# Patient Record
Sex: Male | Born: 1975 | Race: Black or African American | Hispanic: No | Marital: Single | Smoking: Never smoker
Health system: Southern US, Community
[De-identification: ages and names within clinical notes are randomized; demographics above are authoritative.]

## PROBLEM LIST (undated history)

## (undated) DIAGNOSIS — T7840XA Allergy, unspecified, initial encounter: Secondary | ICD-10-CM

## (undated) HISTORY — DX: Allergy, unspecified, initial encounter: T78.40XA

---

## 1997-10-07 ENCOUNTER — Encounter: Admission: RE | Admit: 1997-10-07 | Discharge: 1997-10-07 | Payer: Self-pay | Admitting: *Deleted

## 1997-10-10 ENCOUNTER — Encounter: Admission: RE | Admit: 1997-10-10 | Discharge: 1997-10-10 | Payer: Self-pay | Admitting: *Deleted

## 2004-06-24 ENCOUNTER — Ambulatory Visit: Payer: Self-pay | Admitting: Internal Medicine

## 2004-06-26 ENCOUNTER — Ambulatory Visit: Payer: Self-pay | Admitting: Internal Medicine

## 2004-06-30 ENCOUNTER — Ambulatory Visit (HOSPITAL_COMMUNITY): Admission: RE | Admit: 2004-06-30 | Discharge: 2004-06-30 | Payer: Self-pay | Admitting: Internal Medicine

## 2011-03-10 ENCOUNTER — Ambulatory Visit (INDEPENDENT_AMBULATORY_CARE_PROVIDER_SITE_OTHER): Payer: BC Managed Care – PPO

## 2011-03-10 DIAGNOSIS — Z0000 Encounter for general adult medical examination without abnormal findings: Secondary | ICD-10-CM

## 2011-06-16 ENCOUNTER — Ambulatory Visit (INDEPENDENT_AMBULATORY_CARE_PROVIDER_SITE_OTHER): Payer: BC Managed Care – PPO | Admitting: Family Medicine

## 2011-06-16 ENCOUNTER — Ambulatory Visit: Payer: BC Managed Care – PPO

## 2011-06-16 VITALS — BP 113/73 | HR 61 | Temp 98.2°F | Resp 16 | Ht 71.75 in | Wt 200.4 lb

## 2011-06-16 DIAGNOSIS — M542 Cervicalgia: Secondary | ICD-10-CM

## 2011-06-16 DIAGNOSIS — J309 Allergic rhinitis, unspecified: Secondary | ICD-10-CM

## 2011-06-16 DIAGNOSIS — M25569 Pain in unspecified knee: Secondary | ICD-10-CM

## 2011-06-16 MED ORDER — CYCLOBENZAPRINE HCL 5 MG PO TABS: 5.0000 mg | ORAL_TABLET | Freq: Every evening | ORAL | Status: AC | PRN

## 2011-06-16 MED ORDER — FLUTICASONE PROPIONATE 50 MCG/ACT NA SUSP: 2.0000 | Freq: Every day | NASAL | Status: DC

## 2011-06-16 MED ORDER — NAPROXEN 500 MG PO TABS: 500.0000 mg | ORAL_TABLET | Freq: Two times a day (BID) | ORAL | Status: DC

## 2011-06-16 NOTE — Progress Notes (Signed)
[facility]:  Office Visit  Chief Complaint:  Chief Complaint  Patient presents with  . Knee Pain    x 1 month swelling and painful  . Neck Pain    x 2 weeks sore after playing basketball  . Facial Pain    sinus pressure sneezing runny nose x 2 weeks    HPI: Zachary Harrington is a 36 y.o. male who complains of : 1. Right knee pain-patient states there is no pain just a little pressure x 1 month. After playing basketball. Hears a pop now and again. Denies numbness, weakness, tingling, or instability. Tried Advil and Ibuprofen regular. SOme relief 2. Neck pain-worse when laying down. Feels mostly on right side. X 2 weeks since after basketball game and ran into shoulder of another player at full impact. Denies confusion, vision changes, HA.  Rt >left side affect neck. Has tried Ibuprofen with some relief. 4/10 uncomforatbale pain, has to change positon to feel more comfortable.  3. Sinuses and allergies. Takes Clotrimaton ( OTC brand).   Past Medical History  Diagnosis Date  . Allergy    History reviewed. No pertinent past surgical history. History   Social History  . Marital Status: Married    Spouse Name: N/A    Number of Children: N/A  . Years of Education: N/A   Social History Main Topics  . Smoking status: Former Smoker    Types: Cigars  . Smokeless tobacco: None  . Alcohol Use: Yes  . Drug Use: No  . Sexually Active: None   Other Topics Concern  . None   Social History Narrative  . None   Family History  Problem Relation Age of Onset  . COPD Mother    No Known Allergies Prior to Admission medications   Not on File     ROS: The patient denies fevers, chills, night sweats, unintentional weight loss, chest pain, palpitations, wheezing, dyspnea on exertion, nausea, vomiting, abdominal pain, dysuria, hematuria, melena, numbness, weakness, or tingling.   All other systems have been reviewed and were otherwise negative with the exception of  those mentioned in the HPI and as above.    PHYSICAL EXAM: Filed Vitals:   06/16/11 1500  BP: 113/73  Pulse: 61  Temp: 98.2 F (36.8 C)  Resp: 16   Filed Vitals:   06/16/11 1500  Height: 5' 11.75" (1.822 m)  Weight: 200 lb 6.4 oz (90.901 kg)   Body mass index is 27.37 kg/(m^2).  General: Alert, no acute distress HEENT:  Normocephalic, atraumatic, oropharynx patent. Tm nl, no sinus tenderness, no exudates. PERRLA, EOMI Cardiovascular:  Regular rate and rhythm, no rubs murmurs or gallops.  No Carotid bruits, radial pulse intact. No pedal edema.  Respiratory: Clear to auscultation bilaterally.  No wheezes, rales, or rhonchi.  No cyanosis, no use of accessory musculature GI: No organomegaly, abdomen is soft and non-tender, positive bowel sounds.  No masses. Skin: No rashes. Neurologic: Facial musculature symmetric. Psychiatric: Patient is appropriate throughout our interaction. Lymphatic: No cervical lymphadenopathy Musculoskeletal: Gait intact. Neck: nl neurovasc exam, full AROM/PROM, mild paraspinal c-spine tenderness, neg. Spurling Right knee-full AROM/PROM, neg McMurray, neg LCL/MCL pain, + mild swelling on lateral knee, No crepitus, Neg Lachmans,    LABS: No results found for this or any previous visit.   EKG/XRAY:   Primary read interpreted by Dr. Conley Rolls at [facility].  Normal C-spine. NO fx/disloction   ASSESSMENT/PLAN: Encounter Diagnoses  Name Primary?  . Neck pain Yes  . Knee  pain    1. Naproxen 500 mg BID, RICE. C-spine xray were negative. Most likely msk sprain/strain. We will defer knee xray for now. IF the Naproxen does not help then he can return in 2-4 weeks and get xray and possible aspiration +/-  Steroid knee injection if desires. C/w ROM exercises. 2. Flexeril 5 mg qhs prn 3. Flonase, OTC antihistamine 4. Work note given for today and tomorrow 4/17-18    Rockne Coons, DO 06/16/2011 3:56 PM

## 2011-06-17 ENCOUNTER — Telehealth: Payer: Self-pay

## 2011-06-17 NOTE — Telephone Encounter (Signed)
Pt seen in office yesterday for knee problems, he states he forgot to talk to Dr about him having anxiety, and sweating, not being able to sleep and heart beating fast he wants to know would he need to be seen again or can Dr call him in a rx for this.

## 2011-06-17 NOTE — Telephone Encounter (Signed)
Left message for patient to come back to clinic for the symptoms he listed in his phone message.

## 2011-06-24 ENCOUNTER — Telehealth: Payer: Self-pay | Admitting: Family Medicine

## 2011-06-24 NOTE — Telephone Encounter (Signed)
LM for patient about coming in if he has othe issues he wants to discuss ie night sweats, anxiety . Also let him know that the radiologist official reading of c-spine xray recommended f/u CXR since they had some concerns about the soft tissue along the lung apex. Asked him to come in to f/u on those 2 things.

## 2011-07-20 ENCOUNTER — Ambulatory Visit (INDEPENDENT_AMBULATORY_CARE_PROVIDER_SITE_OTHER): Payer: BC Managed Care – PPO | Admitting: Internal Medicine

## 2011-07-20 VITALS — BP 123/75 | HR 65 | Temp 97.4°F | Resp 16 | Ht 71.5 in | Wt 203.0 lb

## 2011-07-20 DIAGNOSIS — J019 Acute sinusitis, unspecified: Secondary | ICD-10-CM

## 2011-07-20 DIAGNOSIS — F410 Panic disorder [episodic paroxysmal anxiety] without agoraphobia: Secondary | ICD-10-CM

## 2011-07-20 DIAGNOSIS — J329 Chronic sinusitis, unspecified: Secondary | ICD-10-CM

## 2011-07-20 DIAGNOSIS — Z789 Other specified health status: Secondary | ICD-10-CM

## 2011-07-20 DIAGNOSIS — J301 Allergic rhinitis due to pollen: Secondary | ICD-10-CM

## 2011-07-20 MED ORDER — AMOXICILLIN 500 MG PO CAPS
1000.0000 mg | ORAL_CAPSULE | Freq: Two times a day (BID) | ORAL | Status: AC
Start: 2011-07-20 — End: 2011-07-30

## 2011-07-20 MED ORDER — ALPRAZOLAM 1 MG PO TABS
1.0000 mg | ORAL_TABLET | Freq: Every evening | ORAL | Status: AC | PRN
Start: 2011-07-20 — End: 2011-08-19

## 2011-07-20 NOTE — Patient Instructions (Signed)
Sinusitis Sinuses are air pockets within the bones of your face. The growth of bacteria within a sinus leads to infection. The infection prevents the sinuses from draining. This infection is called sinusitis. SYMPTOMS  There will be different areas of pain depending on which sinuses have become infected.  The maxillary sinuses often produce pain beneath the eyes.   Frontal sinusitis may cause pain in the middle of the forehead and above the eyes.  Other problems (symptoms) include:  Toothaches.   Colored, pus-like (purulent) drainage from the nose.   Swelling, warmth, and tenderness over the sinus areas may be signs of infection.  TREATMENT  Sinusitis is most often determined by an exam.X-rays may be taken. If x-rays have been taken, make sure you obtain your results or find out how you are to obtain them. Your caregiver may give you medications (antibiotics). These are medications that will help kill the bacteria causing the infection. You may also be given a medication (decongestant) that helps to reduce sinus swelling.  HOME CARE INSTRUCTIONS   Only take over-the-counter or prescription medicines for pain, discomfort, or fever as directed by your caregiver.   Drink extra fluids. Fluids help thin the mucus so your sinuses can drain more easily.   Applying either moist heat or ice packs to the sinus areas may help relieve discomfort.   Use saline nasal sprays to help moisten your sinuses. The sprays can be found at your local drugstore.  SEEK IMMEDIATE MEDICAL CARE IF:  You have a fever.   You have increasing pain, severe headaches, or toothache.   You have nausea, vomiting, or drowsiness.   You develop unusual swelling around the face or trouble seeing.  MAKE SURE YOU:   Understand these instructions.   Will watch your condition.   Will get help right away if you are not doing well or get worse.  Document Released: 02/15/2005 Document Revised: 02/04/2011 Document Reviewed:  09/14/2006 [facility] Patient Information 2012 [place], [place].Anxiety and Panic Attacks Your caregiver has informed you that you are having an anxiety or panic attack. There may be many forms of this. Most of the time these attacks come suddenly and without warning. They come at any time of day, including periods of sleep, and at any time of life. They may be strong and unexplained. Although panic attacks are very scary, they are physically harmless. Sometimes the cause of your anxiety is not known. Anxiety is a protective mechanism of the body in its fight or flight mechanism. Most of these perceived danger situations are actually nonphysical situations (such as anxiety over losing a job). CAUSES  The causes of an anxiety or panic attack are many. Panic attacks may occur in otherwise healthy people given a certain set of circumstances. There may be a genetic cause for panic attacks. Some medications may also have anxiety as a side effect. SYMPTOMS  Some of the most common feelings are:  Intense terror.   Dizziness, feeling faint.   Hot and cold flashes.   Fear of going crazy.   Feelings that nothing is real.   Sweating.   Shaking.   Chest pain or a fast heartbeat (palpitations).   Smothering, choking sensations.   Feelings of impending doom and that death is near.   Tingling of extremities, this may be from over-breathing.   Altered reality (derealization).   Being detached from yourself (depersonalization).  Several symptoms can be present to make up anxiety or panic attacks. DIAGNOSIS  The evaluation by your  caregiver will depend on the type of symptoms you are experiencing. The diagnosis of anxiety or panic attack is made when no physical illness can be determined to be a cause of the symptoms. TREATMENT  Treatment to prevent anxiety and panic attacks may include:  Avoidance of circumstances that cause anxiety.   Reassurance and relaxation.   Regular exercise.    Relaxation therapies, such as yoga.   Psychotherapy with a psychiatrist or therapist.   Avoidance of caffeine, alcohol and illegal drugs.   Prescribed medication.  SEEK IMMEDIATE MEDICAL CARE IF:   You experience panic attack symptoms that are different than your usual symptoms.   You have any worsening or concerning symptoms.  Document Released: 02/15/2005 Document Revised: 02/04/2011 Document Reviewed: 06/19/2009 [facility] Patient Information 2012 [place], [place].

## 2011-07-20 NOTE — Progress Notes (Signed)
  Subjective:    Patient ID: Zachary Harrington, male    DOB: 28-Feb-1976, 36 y.o.   MRN: [id number]  HPI Has allergys and green nasal disch with facial pain. Also stress and anxiety attacks from separation.   Review of Systems     Objective:   Physical Exam Sinuses tender Nasal passages red swollen, purulent disch       Assessment & Plan:  Amoxil 1g bid Alprazolam 1mg  hs Counsel

## 2012-04-23 ENCOUNTER — Ambulatory Visit (INDEPENDENT_AMBULATORY_CARE_PROVIDER_SITE_OTHER): Payer: BC Managed Care – PPO | Admitting: Internal Medicine

## 2012-04-23 ENCOUNTER — Ambulatory Visit: Payer: BC Managed Care – PPO

## 2012-04-23 VITALS — BP 112/74 | HR 88 | Temp 98.4°F | Resp 17 | Ht 72.0 in | Wt 206.0 lb

## 2012-04-23 DIAGNOSIS — Z889 Allergy status to unspecified drugs, medicaments and biological substances status: Secondary | ICD-10-CM

## 2012-04-23 DIAGNOSIS — J329 Chronic sinusitis, unspecified: Secondary | ICD-10-CM

## 2012-04-23 DIAGNOSIS — Z23 Encounter for immunization: Secondary | ICD-10-CM

## 2012-04-23 DIAGNOSIS — J3489 Other specified disorders of nose and nasal sinuses: Secondary | ICD-10-CM

## 2012-04-23 DIAGNOSIS — Z9109 Other allergy status, other than to drugs and biological substances: Secondary | ICD-10-CM

## 2012-04-23 DIAGNOSIS — R519 Headache, unspecified: Secondary | ICD-10-CM

## 2012-04-23 DIAGNOSIS — R51 Headache: Secondary | ICD-10-CM

## 2012-04-23 LAB — POCT CBC
Hemoglobin: 13.1 g/dL — AB (ref 14.1–18.1)
Lymph, poc: 2.5 (ref 0.6–3.4)
MCH, POC: 31.2 pg (ref 27–31.2)
MID (cbc): 0.6 (ref 0–0.9)
POC Granulocyte: 4.5 (ref 2–6.9)
POC MID %: 7.4 %M (ref 0–12)
RBC: 4.2 M/uL — AB (ref 4.69–6.13)

## 2012-04-23 MED ORDER — AZELASTINE HCL 0.1 % NA SOLN: 1.0000 | Freq: Two times a day (BID) | NASAL | Status: DC

## 2012-04-23 MED ORDER — FLUTICASONE PROPIONATE 50 MCG/ACT NA SUSP: 2.0000 | Freq: Every day | NASAL | Status: DC

## 2012-04-23 MED ORDER — AMOXICILLIN 500 MG PO CAPS: 1000.0000 mg | ORAL_CAPSULE | Freq: Two times a day (BID) | ORAL | Status: DC

## 2012-04-23 MED ORDER — METHYLPREDNISOLONE ACETATE 80 MG/ML IJ SUSP
120.0000 mg | Freq: Once | INTRAMUSCULAR | Status: AC
  Administered 2012-04-23: 120 mg via INTRAMUSCULAR

## 2012-04-23 MED ORDER — CETIRIZINE HCL 10 MG PO TABS: 10.0000 mg | ORAL_TABLET | Freq: Every day | ORAL | Status: DC

## 2012-04-23 MED ORDER — LORATADINE 10 MG PO TABS: 10.0000 mg | ORAL_TABLET | Freq: Every day | ORAL | Status: DC

## 2012-04-23 NOTE — Patient Instructions (Signed)
Allergic Rhinitis Allergic rhinitis is when the mucous membranes in the nose respond to allergens. Allergens are particles in the air that cause your body to have an allergic reaction. This causes you to release allergic antibodies. Through a chain of events, these eventually cause you to release histamine into the blood stream (hence the use of antihistamines). Although meant to be protective to the body, it is this release that causes your discomfort, such as frequent sneezing, congestion and an itchy runny nose.  CAUSES  The pollen allergens may come from grasses, trees, and weeds. This is seasonal allergic rhinitis, or "hay fever." Other allergens cause year-round allergic rhinitis (perennial allergic rhinitis) such as house dust mite allergen, pet dander and mold spores.  SYMPTOMS   Nasal stuffiness (congestion).  Runny, itchy nose with sneezing and tearing of the eyes.  There is often an itching of the mouth, eyes and ears. It cannot be cured, but it can be controlled with medications. DIAGNOSIS  If you are unable to determine the offending allergen, skin or blood testing may find it. TREATMENT   Avoid the allergen.  Medications and allergy shots (immunotherapy) can help.  Hay fever may often be treated with antihistamines in pill or nasal spray forms. Antihistamines block the effects of histamine. There are over-the-counter medicines that may help with nasal congestion and swelling around the eyes. Check with your caregiver before taking or giving this medicine. If the treatment above does not work, there are many new medications your caregiver can prescribe. Stronger medications may be used if initial measures are ineffective. Desensitizing injections can be used if medications and avoidance fails. Desensitization is when a patient is given ongoing shots until the body becomes less sensitive to the allergen. Make sure you follow up with your caregiver if problems continue. SEEK MEDICAL  CARE IF:   You develop fever (more than 100.5 F (38.1 C).  You develop a cough that does not stop easily (persistent).  You have shortness of breath.  You start wheezing.  Symptoms interfere with normal daily activities. Document Released: 11/10/2000 Document Revised: 05/10/2011 Document Reviewed: 05/22/2008 [employer] Patient Information 2013 [employer]. Sinusitis Sinusitis is redness, soreness, and swelling (inflammation) of the paranasal sinuses. Paranasal sinuses are air pockets within the bones of your face (beneath the eyes, the middle of the forehead, or above the eyes). In healthy paranasal sinuses, mucus is able to drain out, and air is able to circulate through them by way of your nose. However, when your paranasal sinuses are inflamed, mucus and air can become trapped. This can allow bacteria and other germs to grow and cause infection. Sinusitis can develop quickly and last only a short time (acute) or continue over a long period (chronic). Sinusitis that lasts for more than 12 weeks is considered chronic.  CAUSES  Causes of sinusitis include:  Allergies.  Structural abnormalities, such as displacement of the cartilage that separates your nostrils (deviated septum), which can decrease the air flow through your nose and sinuses and affect sinus drainage.  Functional abnormalities, such as when the small hairs (cilia) that line your sinuses and help remove mucus do not work properly or are not present. SYMPTOMS  Symptoms of acute and chronic sinusitis are the same. The primary symptoms are pain and pressure around the affected sinuses. Other symptoms include:  Upper toothache.  Earache.  Headache.  Bad breath.  Decreased sense of smell and taste.  A cough, which worsens when you are lying flat.  Fatigue.    Fever.  Thick drainage from your nose, which often is green and may contain pus (purulent).  Swelling and warmth over the affected sinuses. DIAGNOSIS    Your caregiver will perform a physical exam. During the exam, your caregiver may:  Look in your nose for signs of abnormal growths in your nostrils (nasal polyps).  Tap over the affected sinus to check for signs of infection.  View the inside of your sinuses (endoscopy) with a special imaging device with a light attached (endoscope), which is inserted into your sinuses. If your caregiver suspects that you have chronic sinusitis, one or more of the following tests may be recommended:  Allergy tests.  Nasal culture A sample of mucus is taken from your nose and sent to a lab and screened for bacteria.  Nasal cytology A sample of mucus is taken from your nose and examined by your caregiver to determine if your sinusitis is related to an allergy. TREATMENT  Most cases of acute sinusitis are related to a viral infection and will resolve on their own within 10 days. Sometimes medicines are prescribed to help relieve symptoms (pain medicine, decongestants, nasal steroid sprays, or saline sprays).  However, for sinusitis related to a bacterial infection, your caregiver will prescribe antibiotic medicines. These are medicines that will help kill the bacteria causing the infection.  Rarely, sinusitis is caused by a fungal infection. In theses cases, your caregiver will prescribe antifungal medicine. For some cases of chronic sinusitis, surgery is needed. Generally, these are cases in which sinusitis recurs more than 3 times per year, despite other treatments. HOME CARE INSTRUCTIONS   Drink plenty of water. Water helps thin the mucus so your sinuses can drain more easily.  Use a humidifier.  Inhale steam 3 to 4 times a day (for example, sit in the bathroom with the shower running).  Apply a warm, moist washcloth to your face 3 to 4 times a day, or as directed by your caregiver.  Use saline nasal sprays to help moisten and clean your sinuses.  Take over-the-counter or prescription medicines for  pain, discomfort, or fever only as directed by your caregiver. SEEK IMMEDIATE MEDICAL CARE IF:  You have increasing pain or severe headaches.  You have nausea, vomiting, or drowsiness.  You have swelling around your face.  You have vision problems.  You have a stiff neck.  You have difficulty breathing. MAKE SURE YOU:   Understand these instructions.  Will watch your condition.  Will get help right away if you are not doing well or get worse. Document Released: 02/15/2005 Document Revised: 05/10/2011 Document Reviewed: 03/02/2011 [employer] Patient Information 2013 [employer].  

## 2012-04-23 NOTE — Progress Notes (Signed)
  Subjective:    Patient ID: Zachary Harrington, male    DOB: 27-Jan-1976, 37 y.o.   MRN: [id number]  HPI Has hx for chronic allergys causing recurrent HAs and sinus infections. Symptoms triggered and worsened by dust and other allergy exposures. Works at [employer] and sneezes at work often. Sxs worse spring and fall, has never seen allergist.   Review of Systems Chronic nasal obstruction and chronic tickle cough    Objective:   Physical Exam  Vitals reviewed. Constitutional: He is oriented to person, place, and time. He appears well-developed and well-nourished.  HENT:  Right Ear: External ear normal.  Left Ear: External ear normal.  Nose: Mucosal edema, rhinorrhea and sinus tenderness present. Right sinus exhibits maxillary sinus tenderness and frontal sinus tenderness. Left sinus exhibits maxillary sinus tenderness and frontal sinus tenderness.  Mouth/Throat: Oropharynx is clear and moist.  Chronically inflamed nasal mucosa and edematous.  Neck: Normal range of motion. Neck supple. No thyromegaly present.  Cardiovascular: Normal rate and normal heart sounds.   Pulmonary/Chest: Effort normal and breath sounds normal.  Neurological: He is alert and oriented to person, place, and time. No cranial nerve deficit. He exhibits normal muscle tone. Coordination normal.  Psychiatric: He has a normal mood and affect.    Results for orders placed in visit on 04/23/12  POCT CBC      Result Value Range   WBC 7.6  4.6 - 10.2 K/uL   Lymph, poc 2.5  0.6 - 3.4   POC LYMPH PERCENT 33.1  10 - 50 %L   MID (cbc) 0.6  0 - 0.9   POC MID % 7.4  0 - 12 %M   POC Granulocyte 4.5  2 - 6.9   Granulocyte percent 59.5  37 - 80 %G   RBC 4.20 (*) 4.69 - 6.13 M/uL   Hemoglobin 13.1 (*) 14.1 - 18.1 g/dL   HCT, POC 14.7 (*) 82.9 - 53.7 %   MCV 99.0 (*) 80 - 97 fL   MCH, POC 31.2  27 - 31.2 pg   MCHC 31.5 (*) 31.8 - 35.4 g/dL   RDW, POC 56.2     Platelet Count, POC 290  142 - 424 K/uL   MPV 8.2  0 - 99.8  fL     [facility] reading (PRIMARY) by  Dr.Odetta Forness cloudy pansinusitis      Assessment & Plan:  FMLA papers signed and approved Chronic sinusitis caused by allergy Chronic HAs and cough Refer to allergist Depomedrol 120mg  Zyrtec/claritin/astepro/fluticasone Amoxil 1g bid for 2-4 weeks

## 2012-05-02 ENCOUNTER — Other Ambulatory Visit: Payer: Self-pay | Admitting: Internal Medicine

## 2012-05-13 ENCOUNTER — Telehealth: Payer: Self-pay | Admitting: *Deleted

## 2012-05-13 NOTE — Telephone Encounter (Signed)
[facility] [address] requesting refill on alprazolam 1mg  tablet.  Last fill on 07/20/11

## 2012-05-14 NOTE — Telephone Encounter (Signed)
rtc 

## 2012-05-14 NOTE — Telephone Encounter (Signed)
RTC

## 2012-05-14 NOTE — Telephone Encounter (Signed)
Refusal sent to pharmacy.

## 2012-07-27 ENCOUNTER — Ambulatory Visit (INDEPENDENT_AMBULATORY_CARE_PROVIDER_SITE_OTHER): Payer: BC Managed Care – PPO | Admitting: Family Medicine

## 2012-07-27 VITALS — BP 112/66 | HR 72 | Temp 98.0°F | Resp 17 | Ht 72.0 in | Wt 204.0 lb

## 2012-07-27 DIAGNOSIS — J209 Acute bronchitis, unspecified: Secondary | ICD-10-CM

## 2012-07-27 DIAGNOSIS — G4700 Insomnia, unspecified: Secondary | ICD-10-CM

## 2012-07-27 MED ORDER — ALPRAZOLAM 1 MG PO TABS: 1.0000 mg | ORAL_TABLET | Freq: Every evening | ORAL | Status: DC | PRN

## 2012-07-27 MED ORDER — AZITHROMYCIN 250 MG PO TABS: ORAL_TABLET | ORAL | Status: DC

## 2012-07-27 NOTE — Patient Instructions (Addendum)
Use the azithromycin antibiotic for bronchitis.  Let us know if you are not better in the next few days- Sooner if worse.   You can use the xanax as needed for sleep.  Remember it can be habit forming if overused.

## 2012-07-27 NOTE — Progress Notes (Signed)
[facility] [address], [place] [address] [phone]  Date:  07/27/2012    Name:  Zachary Harrington   DOB:  06-Mar-1975   MRN:  [id number]  PCP:  No primary provider on file.    Chief Complaint: Cough and URI   History of Present Illness:  Zachary Harrington is a 37 y.o. very pleasant male patient who presents with the following:  He has had a cough for about one week- it started with just clearing his throat, then turned into a productive cough.  He sometimes has a severe cough, and can have fits of cough.  Better with eating or drinking.   No fever or chills.  He does feel tired, no body aches No runny or stuffy nose.   No GI symptoms He is generally quite healthy.   NKDA He has tried some mucinex.    He uses the xanax very occasionally for sleeping.  He would like some more of this if possible Patient Active Problem List   Diagnosis Date Noted  . Allergy history unknown 07/20/2011    Past Medical History  Diagnosis Date  . Allergy     History reviewed. No pertinent past surgical history.  History  Substance Use Topics  . Smoking status: Never Smoker   . Smokeless tobacco: Not on file  . Alcohol Use: Yes    Family History  Problem Relation Age of Onset  . COPD Mother     No Known Allergies  Medication list has been reviewed and updated.  Current Outpatient Prescriptions on File Prior to Visit  Medication Sig Dispense Refill  . cetirizine (ZYRTEC) 10 MG tablet Take 1 tablet (10 mg total) by mouth daily.  90 tablet  3  . loratadine (CLARITIN) 10 MG tablet Take 1 tablet (10 mg total) by mouth daily.  90 tablet  3  . ALPRAZolam (XANAX XR) 1 MG 24 hr tablet Take 1 mg by mouth every morning.      Marland Kitchen azelastine (ASTELIN) 137 MCG/SPRAY nasal spray Place 1 spray into the nose 2 (two) times daily. Use in each nostril as directed  30 mL  12  . fluticasone (FLONASE) 50 MCG/ACT nasal spray Place 2 sprays into the nose daily.  16 g  6   No  current facility-administered medications on file prior to visit.    Review of Systems:  As per HPI- otherwise negative.   Physical Examination: Filed Vitals:   07/27/12 1201  BP: 112/66  Pulse: 72  Temp: 98 F (36.7 C)  Resp: 17   Filed Vitals:   07/27/12 1201  Height: 6' (1.829 m)  Weight: 204 lb (92.534 kg)   Body mass index is 27.66 kg/(m^2). Ideal Body Weight: Weight in (lb) to have BMI = 25: 183.9  GEN: WDWN, NAD, Non-toxic, A & O x 3, looks well HEENT: Atraumatic, Normocephalic. Neck supple. No masses, No LAD.  Bilateral TM wnl, oropharynx normal.  PEERL,EOMI.   Nasal cavity is congested Ears and Nose: No external deformity. CV: RRR, No M/G/R. No JVD. No thrill. No extra heart sounds. PULM: CTA B, no wheezes, crackles, rhonchi. No retractions. No resp. distress. No accessory muscle use. EXTR: No c/c/e NEURO Normal gait.  PSYCH: Normally interactive. Conversant. Not depressed or anxious appearing.  Calm demeanor.    Assessment and Plan: Acute bronchitis - Plan: azithromycin (ZITHROMAX) 250 MG tablet  Insomnia - Plan: ALPRAZolam (XANAX) 1 MG tablet  Treat for bronchitis after cough  for one week- zpack.   Xanax prn sleep- avoid habitual use, can cause increased sedation with antihistamines.    See patient instructions for more details.     Signed Abbe Amsterdam, MD

## 2012-09-28 ENCOUNTER — Ambulatory Visit (INDEPENDENT_AMBULATORY_CARE_PROVIDER_SITE_OTHER): Payer: BC Managed Care – PPO | Admitting: Internal Medicine

## 2012-09-28 VITALS — BP 124/80 | HR 69 | Temp 98.1°F | Resp 18 | Ht 73.0 in | Wt 206.0 lb

## 2012-09-28 DIAGNOSIS — IMO0002 Reserved for concepts with insufficient information to code with codable children: Secondary | ICD-10-CM

## 2012-09-28 DIAGNOSIS — M79621 Pain in right upper arm: Secondary | ICD-10-CM

## 2012-09-28 DIAGNOSIS — L02219 Cutaneous abscess of trunk, unspecified: Secondary | ICD-10-CM

## 2012-09-28 DIAGNOSIS — M79609 Pain in unspecified limb: Secondary | ICD-10-CM

## 2012-09-28 DIAGNOSIS — L03111 Cellulitis of right axilla: Secondary | ICD-10-CM

## 2012-09-28 MED ORDER — HYDROCODONE-ACETAMINOPHEN 5-325 MG PO TABS: 1.0000 | ORAL_TABLET | Freq: Four times a day (QID) | ORAL | Status: DC | PRN

## 2012-09-28 MED ORDER — DOXYCYCLINE HYCLATE 100 MG PO TABS: 100.0000 mg | ORAL_TABLET | Freq: Two times a day (BID) | ORAL | Status: DC

## 2012-09-28 MED ORDER — CEFTRIAXONE SODIUM 1 G IJ SOLR: 1.0000 g | Freq: Once | INTRAMUSCULAR | Status: DC

## 2012-09-28 MED ORDER — CEFTRIAXONE SODIUM 1 G IJ SOLR
1.0000 g | INTRAMUSCULAR | Status: DC
  Administered 2012-09-28: 1 g via INTRAMUSCULAR

## 2012-09-28 NOTE — Patient Instructions (Signed)
Staphylococcal Infections  Staphylococcus aureus (Staph) is a germ that may cause infections, especially on broken skin or wounds. Methicillin is a drug sometimes used to treat Staph infections. If the germ is resistant to methicillin, it is called MRSA. Methicillin and some other drugs may not work to treat the infection. However, there are other antibiotic drugs that may be used that will treat the infection. Your caregiver may do a culture from your wound, skin, or other site. This will tell him/her that you have MRSA present. Sometimes healthy people carry MRSA, but it may also cause an infection.  Staph infections, including MRSA can spread from one person to another by contact with an infected person. You may prevent spreading an MRSA infection to those you live with or others around you by following these steps:  Keep infections and pus or drainage material covered with clean dry bandages. Follow your caregiver's instructions on proper care of the wound. Pus from infected wounds can contain MRSA and spread the germ (bacteria) to others.  Advise your family and other close contacts to wash their hands frequently with soap and warm water. This should be done especially if they change your bandages or touch the infected wound or infectious materials.  Avoid sharing personal items (towels, washcloth, razor, clothing, or uniforms) that may have had contact with the infected wound.  Wash linens and clothes that become soiled, with hot water and laundry detergent. Drying clothes in a hot dryer, rather than air-drying, also helps kill bacteria in clothes.  Tell any healthcare providers who treat you that you have an MRSA infection. In the hospital steps will be taken to prevent the spread of MRSA.  Ask your caregiver about return to school or return to work if you have a Staph or MRSA infection.  If your caregiver has given you a follow-up appointment, it is very important to keep that appointment.  Not keeping the appointment could result in a chronic or permanent injury, pain, and disability. If there is any problem keeping the appointment, you must call back to this facility for assistance. Staph and MRSA infections can become very serious and you should contact your caregiver if your infection gets worse. To fight the infection, follow your doctor's instructions for wound care and take all medicines as prescribed. SEEK MEDICAL CARE IF:   You have increased pus coming from the wound.  You have a fever.  You notice a bad smell coming from the wound or dressing. SEEK IMMEDIATE MEDICAL CARE IF:  You have redness, red streaks, swelling, or increasing pain in the wound. Document Released: 05/08/2002 Document Revised: 05/10/2011 Document Reviewed: 10/02/2007 Exit[employer] Patient Information 2014 [employer]. MRSA Overview MRSA stands for methicillin-resistant Staphylococcus aureus. It is a type of bacteria that is resistant to some common antibiotics. It can cause infections in the skin and many other places in the body. Staphylococcus aureus, often called "staph," is a bacteria that normally lives on the skin or in the nose. Staph on the surface of the skin or in the nose does not cause problems. However, if the staph enters the body through a cut, wound, or break in the skin, an infection can happen. Up until recently, infections with the MRSA type of staph mainly occurred in hospitals and other healthcare settings. There are now increasing problems with MRSA infections in the community as well. Infections with MRSA may be very serious or even life-threatening. Most MRSA infections are acquired in one of two ways:  Healthcare-associated   MRSA (HA-MRSA)  This can be acquired by people in any healthcare setting. MRSA can be a big problem for hospitalized people, people in nursing homes, people in rehabilitation facilities, people with weakened immune systems, dialysis patients, and those who have  had surgery.  Community-associated MRSA (CA-MRSA)  Community spread of MRSA is becoming more common. It is known to spread in crowded settings, in jails and prisons, and in situations where there is close skin-to-skin contact, such as during sporting events or in locker rooms. MRSA can be spread through shared items, such as children's toys, razors, towels, or sports equipment. CAUSES  All staph, including MRSA, are normally harmless unless they enter the body through a scratch, cut, or wound, such as with surgery. All staph, including MRSA, can be spread from person-to-person by touching contaminated objects or through direct contact. SPECIAL GROUPS MRSA can present problems for special groups of people. Some of these groups include:  Breastfeeding women.  The most common problem is MRSA infection of the breast (mastitis). There is evidence that MRSA can be passed to an infant from infected breast milk. Your caregiver may recommend that you stop breastfeeding until the mastitis is under control.  If you are breastfeeding and have a MRSA infection in a place other than the breast, you may usually continue breastfeeding while under treatment. If taking antibiotics, ask your caregiver if it is safe to continue breastfeeding while taking your prescribed medicines.  Neonates (babies from birth to 1 month old) and infants (babies from 1 month to 1 year old).  There is evidence that MRSA can be passed to a newborn at birth if the mother has MRSA on the skin, in or around the birth canal, or an infection in the uterus, cervix, or vagina. MRSA infection can have the same appearance as a normal newborn or infant rash or several other skin infections. This can make it hard to diagnose MRSA.  Immune compromised people.  If you have an immune system problem, you may have a higher chance of developing a MRSA infection.  People after any type of surgery.  Staph in general, including MRSA, is the most  common cause of infections occurring at the site of recent surgery.  People on long-term steroid medicines.  These kinds of medicines can lower your resistance to infection. This can increase your chance of getting MRSA.  People who have had frequent hospitalizations, live in nursing homes or other residential care facilities, have venous or urinary catheters, or have taken multiple courses of antibiotic therapy for any reason. DIAGNOSIS  Diagnosis of MRSA is done by cultures of fluid samples that may come from:  Swabs taken from cuts or wounds in infected areas.  Nasal swabs.  Saliva or deep cough specimens from the lungs (sputum).  Urine.  Blood. Many people are "colonized" with MRSA but have no signs of infection. This means that people carry the MRSA germ on their skin or in their nose and may never develop MRSA infection.  TREATMENT  Treatment varies and is based on how serious, how deep, or how extensive the infection is. For example:  Some skin infections, such as a small boil or abscess, may be treated by draining yellowish-white fluid (pus) from the site of the infection.  Deeper or more widespread soft tissue infections are usually treated with surgery to drain pus and with antibiotic medicine given by vein or by mouth. This may be recommended even if you are pregnant.  Serious infections may require   a hospital stay. If antibiotics are given, they may be needed for several weeks. PREVENTION  Because many people are colonized with staph, including MRSA, preventing the spread of the bacteria from person-to-person is most important. The best way to prevent the spread of bacteria and other germs is through proper hand washing or by using alcohol-based hand disinfectants. The following are other ways to help prevent MRSA infection within the hospital and community settings.   Healthcare settings:  Strict hand washing or hand disinfection procedures need to be followed before  and after touching every patient.  Patients infected with MRSA are placed in isolation to prevent the spread of the bacteria.  Healthcare workers need to wear disposable gowns and gloves when touching or caring for patients infected with MRSA. Visitors may also be asked to wear a gown and gloves.  Hospital surfaces need to be disinfected frequently.  Community settings:  Wash your hands frequently with soap and water for at least 15 seconds. Otherwise, use alcohol-based hand disinfectants when soap and water is not available.  Make sure people who live with you wash their hands often, too.  Do not share personal items. For example, avoid sharing razors and other personal hygiene items, towels, clothing, and athletic equipment.  Wash and dry your clothes and bedding at the warmest temperatures recommended on the labels.  Keep wounds covered. Pus from infected sores may contain MRSA and other bacteria. Keep cuts and abrasions clean and covered with germ-free (sterile), dry bandages until they are healed.  If you have a wound that appears infected, ask your caregiver if a culture for MRSA and other bacteria should be done.  If you are breastfeeding, talk to your caregiver about MRSA. You may be asked to temporarily stop breastfeeding. HOME CARE INSTRUCTIONS   Take your antibiotics as directed. Finish them even if you start to feel better.  Avoid close contact with those around you as much as possible. Do not use towels, razors, toothbrushes, bedding, or other items that will be used by others.  To fight the infection, follow your caregiver's instructions for wound care. Wash your hands before and after changing your bandages.  If you have an intravascular device, such as a catheter, make sure you know how to care for it.  Be sure to tell any healthcare providers that you have MRSA so they are aware of your infection. SEEK IMMEDIATE MEDICAL CARE IF:   The infection appears to be  getting worse. Signs include:  Increased warmth, redness, or tenderness around the wound site.  A red line that extends from the infection site.  A dark color in the area around the infection.  Wound drainage that is tan, yellow, or green.  A bad smell coming from the wound.  You feel sick to your stomach (nauseous) and throw up (vomit) or cannot keep medicine down.  You have a fever.  Your baby is older than 3 months with a rectal temperature of 102 F (38.9 C) or higher.  Your baby is 3 months old or younger with a rectal temperature of 100.4 F (38 C) or higher.  You have difficulty breathing. MAKE SURE YOU:   Understand these instructions.  Will watch your condition.  Will get help right away if you are not doing well or get worse. Document Released: 02/15/2005 Document Revised: 05/10/2011 Document Reviewed: 05/20/2010 [employer] Patient Information 2014 [employer].  

## 2012-09-28 NOTE — Progress Notes (Signed)
  Subjective:    Patient ID: Zachary Harrington, male    DOB: 1975/10/30, 37 y.o.   MRN: [id number]  HPI For 2 weeks has been struggling with boils chest wall and axillae, no previous hx this problem. Children are not infected, he has no mrsa exposure hx.   Review of Systems     Objective:   Physical Exam  Vitals reviewed. Constitutional: He is oriented to person, place, and time. He appears well-developed and well-nourished.  HENT:  Nose: Nose normal.  Cardiovascular: Normal rate, regular rhythm and normal heart sounds.   Pulmonary/Chest: Effort normal and breath sounds normal.  Musculoskeletal: Normal range of motion.  Neurological: He is alert and oriented to person, place, and time. No cranial nerve deficit. He exhibits normal muscle tone. Coordination normal.  Skin: Lesion and rash noted. Rash is pustular. There is erythema.     boils  Psychiatric: He has a normal mood and affect.   ID by Eula Listen PAc       Assessment & Plan:  ID abscesses MRSA care/Wound care Doxycycline 100mg  bid/Rocephin 1 gram

## 2012-09-30 ENCOUNTER — Ambulatory Visit (INDEPENDENT_AMBULATORY_CARE_PROVIDER_SITE_OTHER): Payer: BC Managed Care – PPO | Admitting: Physician Assistant

## 2012-09-30 VITALS — BP 125/85 | HR 67 | Temp 98.0°F | Resp 16 | Ht 71.5 in | Wt 207.0 lb

## 2012-09-30 DIAGNOSIS — IMO0002 Reserved for concepts with insufficient information to code with codable children: Secondary | ICD-10-CM

## 2012-09-30 DIAGNOSIS — L03111 Cellulitis of right axilla: Secondary | ICD-10-CM

## 2012-09-30 NOTE — Patient Instructions (Signed)
Continue the antibiotic as prescribed. Apply a warm compress to the area for 15-20 minutes 2-3 times daily. Change the dressing as needed.

## 2012-09-30 NOTE — Progress Notes (Signed)
  Subjective:    Patient ID: Zachary Harrington, male    DOB: [birth year], 37 y.o.   MRN: [id number]  HPI 37 y.o. Male presents to clinic today for recheck of abscesses. For 2 weeks pt has been struggling with boils chest wall and axillae, no previous hx this problem. He has no mrsa exposure hx.   Pt states that he is feeling better. Not having any fever or chills. Abscess in right axilla still draining a lot. Pt placed athletic tape over some dressing at home and it tore some of his skin off. He received Rocephin shot on 7/31 and began Doxycycline. Tolerating medications well.   Review of Systems  Constitutional: Negative for fever and chills.  Eyes: Negative for visual disturbance.  Respiratory: Negative for chest tightness and shortness of breath.   Cardiovascular: Negative for chest pain, palpitations and leg swelling.  Gastrointestinal: Negative for nausea, vomiting, abdominal pain and diarrhea.  Genitourinary: Negative for difficulty urinating.  Musculoskeletal: Negative for myalgias and arthralgias.  Neurological: Negative for headaches.  All other systems reviewed and are negative.       Objective:   Physical Exam  Nursing note and vitals reviewed. Constitutional: He is oriented to person, place, and time. Vital signs are normal. He appears well-developed and well-nourished. No distress.  HENT:  Head: Normocephalic and atraumatic.  Right Ear: External ear normal.  Left Ear: External ear normal.  Nose: Nose normal.  Eyes: Conjunctivae are normal.  Neck: Trachea normal and normal range of motion. Neck supple. No thyromegaly present.  Cardiovascular: Normal rate, regular rhythm and normal heart sounds.   Pulmonary/Chest: Effort normal and breath sounds normal.  Abdominal: Soft. Normal appearance. There is no tenderness.  Musculoskeletal: Normal range of motion.  Lymphadenopathy:    He has no cervical adenopathy.  Neurological: He is alert and oriented to person, place, and  time.  Skin: Skin is warm and dry. He is not diaphoretic. No pallor.     4 small boils healing. Lesion and rash noted. Rash is pustular. There is erythema.   Psychiatric: He has a normal mood and affect. His behavior is normal. Judgment and thought content normal.     Results for orders placed in visit on 09/28/12  WOUND CULTURE      Result Value Range   Gram Stain Rare     Gram Stain WBC present-predominately PMN     Gram Stain No Squamous Epithelial Cells Seen     Gram Stain Rare Gram Positive Cocci In Pairs          Assessment & Plan:   Abscesses healing. Wound care continued on axillary abscess. Area repacked with 1/4 in packing. Patient to return for f/u in 2 days. Continue Doxycycline as prescribed. Continue warm compresses and wound care.

## 2012-10-01 LAB — WOUND CULTURE: Gram Stain: NONE SEEN

## 2012-10-01 NOTE — Progress Notes (Signed)
I directly supervised and participated in the procedure and agree with the student's documentation.  

## 2012-10-02 ENCOUNTER — Ambulatory Visit (INDEPENDENT_AMBULATORY_CARE_PROVIDER_SITE_OTHER): Payer: BC Managed Care – PPO | Admitting: Physician Assistant

## 2012-10-02 VITALS — BP 122/60 | HR 72 | Temp 97.7°F | Resp 16 | Ht 71.5 in | Wt 206.0 lb

## 2012-10-02 DIAGNOSIS — L0390 Cellulitis, unspecified: Secondary | ICD-10-CM

## 2012-10-02 DIAGNOSIS — L0291 Cutaneous abscess, unspecified: Secondary | ICD-10-CM

## 2012-10-02 DIAGNOSIS — B9562 Methicillin resistant Staphylococcus aureus infection as the cause of diseases classified elsewhere: Secondary | ICD-10-CM

## 2012-10-02 NOTE — Progress Notes (Signed)
   [address], [place] [address]   Phone [phone]  Subjective:    Patient ID: Zachary Harrington, male    DOB: Jul 25, 1975, 37 y.o.   MRN: [id number]  HPI Pt presents to clinic for wound recheck.  The area is feeling slightly better.  Drainage has decreased.  Tolerating abx ok.   Review of Systems  Constitutional: Negative for fever and chills.  Gastrointestinal: Negative for nausea.  Skin: Positive for wound.       Objective:   Physical Exam  Vitals reviewed. Constitutional: He is oriented to person, place, and time. He appears well-developed and well-nourished.  HENT:  Head: Normocephalic and atraumatic.  Right Ear: External ear normal.  Left Ear: External ear normal.  Pulmonary/Chest: Effort normal.  Neurological: He is alert and oriented to person, place, and time.  Skin: Skin is warm and dry.  R axilla - drsg and packing removed.  No purulence drainage from the area.  No erythema, some induration right around the incision.  Irrigated with 2% lido then repacked with 1/4 in plain packing - Drsg placed.  Psychiatric: He has a normal mood and affect. His behavior is normal. Judgment and thought content normal.          Assessment & Plan:  MRSA cellulitis continue abx.  Continue daily drsg changes.  Recheck in 4 days.  Benny Lennert PA-C 10/02/2012 6:04 PM

## 2012-10-02 NOTE — Patient Instructions (Addendum)
Hibiclens 

## 2012-10-03 ENCOUNTER — Encounter: Payer: Self-pay | Admitting: *Deleted

## 2012-10-05 ENCOUNTER — Ambulatory Visit (INDEPENDENT_AMBULATORY_CARE_PROVIDER_SITE_OTHER): Payer: BC Managed Care – PPO | Admitting: Physician Assistant

## 2012-10-05 VITALS — BP 118/72 | HR 68 | Temp 98.3°F | Resp 16 | Ht 73.0 in | Wt 206.0 lb

## 2012-10-05 DIAGNOSIS — L02419 Cutaneous abscess of limb, unspecified: Secondary | ICD-10-CM

## 2012-10-05 DIAGNOSIS — Z5189 Encounter for other specified aftercare: Secondary | ICD-10-CM

## 2012-10-05 NOTE — Progress Notes (Signed)
Patient ID: MAKYI LEDO MRN: [id number], DOB: 19-Mar-1975 37 y.o. Date of Encounter: 10/05/2012, 12:16 PM  Chief Complaint: Wound care   See previous note  HPI: 37 y.o. y/o male presents for wound care s/p I&D on 09/28/12 Doing well No issues or complaints Afebrile/ no chills No nausea or vomiting Tolerating doxycycline.  Pain improved.  Daily dressing change Previous note reviewed  Past Medical History  Diagnosis Date  . Allergy   . Cataract      Home Meds: Prior to Admission medications   Medication Sig Start Date End Date Taking? Authorizing Provider  ALPRAZolam Prudy Feeler) 1 MG tablet Take 1 tablet (1 mg total) by mouth at bedtime as needed for sleep. 07/27/12  Yes Gwenlyn Found Copland, MD  azelastine (ASTELIN) 137 MCG/SPRAY nasal spray Place 1 spray into the nose 2 (two) times daily. Use in each nostril as directed 04/23/12  Yes Jonita Albee, MD  cetirizine (ZYRTEC) 10 MG tablet Take 1 tablet (10 mg total) by mouth daily. 04/23/12  Yes Jonita Albee, MD  doxycycline (VIBRA-TABS) 100 MG tablet Take 1 tablet (100 mg total) by mouth 2 (two) times daily. 09/28/12  Yes Jonita Albee, MD  fluticasone [facility]) 50 MCG/ACT nasal spray Place 2 sprays into the nose daily. 04/23/12  Yes Jonita Albee, MD  HYDROcodone-acetaminophen (NORCO/VICODIN) 5-325 MG per tablet Take 1 tablet by mouth every 6 (six) hours as needed for pain. 09/28/12  Yes Jonita Albee, MD  loratadine (CLARITIN) 10 MG tablet Take 1 tablet (10 mg total) by mouth daily. 04/23/12  Yes Jonita Albee, MD  azithromycin [facility]) 250 MG tablet Use as a zpack 07/27/12   Gwenlyn Found Copland, MD    Allergies: No Known Allergies  ROS: Constitutional: Afebrile, no chills Cardiovascular: negative for chest pain or palpitations Dermatological: Positive for wound. Negative for erythema, pain, or warmth.  GI: No nausea or vomiting   EXAM: Physical Exam: Blood pressure 118/72, pulse 68, temperature 98.3 F (36.8 C), temperature  source Oral, resp. rate 16, height 6\' 1"  (1.854 m), weight 206 lb (93.441 kg), SpO2 100.00%., Body mass index is 27.18 kg/(m^2). General: Well developed, well nourished, in no acute distress. Nontoxic appearing. Head: Normocephalic, atraumatic, sclera non-icteric.  Neck: Supple. Lungs: Breathing is unlabored. Heart: Normal rate. Skin:  Warm and moist. Dressing and packing in place. No induration, erythema, or tenderness to palpation. Neuro: Alert and oriented X 3. Moves all extremities spontaneously. Normal gait.  Psych:  Responds to questions appropriately with a normal affect.       PROCEDURE: Dressing and packing removed. No purulence expressed Wound bed healthy Irrigated with 1% plain lidocaine 5 cc. Not repacked.  Dressing applied  LAB: Culture: MRSA  A/P: 37 y.o. y/o male with axillary cellulitis/abscess as above s/p I&D on 09/28/12.  Wound care per above Continue doxycycline Pain well controlled Daily dressing changes Recheck as needed.   Grier Mitts, PA-C 10/05/2012 12:16 PM

## 2012-10-27 ENCOUNTER — Encounter: Payer: Self-pay | Admitting: Internal Medicine

## 2012-10-31 ENCOUNTER — Ambulatory Visit (INDEPENDENT_AMBULATORY_CARE_PROVIDER_SITE_OTHER): Payer: BC Managed Care – PPO | Admitting: Emergency Medicine

## 2012-10-31 VITALS — BP 120/78 | HR 71 | Temp 98.0°F | Resp 16 | Ht 72.0 in | Wt 209.0 lb

## 2012-10-31 DIAGNOSIS — J018 Other acute sinusitis: Secondary | ICD-10-CM

## 2012-10-31 MED ORDER — AMOXICILLIN-POT CLAVULANATE ER 1000-62.5 MG PO TB12: 2.0000 | ORAL_TABLET | Freq: Two times a day (BID) | ORAL | Status: DC

## 2012-10-31 MED ORDER — PSEUDOEPHEDRINE-GUAIFENESIN ER 60-600 MG PO TB12: 1.0000 | ORAL_TABLET | Freq: Two times a day (BID) | ORAL | Status: AC

## 2012-10-31 NOTE — Patient Instructions (Addendum)

## 2012-10-31 NOTE — Progress Notes (Signed)
[facility] [address] [place] [address] [phone]  Date:  10/31/2012   Name:  Zachary Harrington   DOB:  03-10-1975   MRN:  [id number]  PCP:  No primary provider on file.    Chief Complaint: Sinusitis   History of Present Illness:  Zachary Harrington is a 37 y.o. very pleasant male patient who presents with the following:  Ill since Wednesday with nasal congestion, headache, facial pain and fever.  No chills. Had a productive cough with purulent sputum.  Mucopurulent nasal drainage.  No nausea or vomiting.  No wheezing or shortness of breath.  No improvement with over the counter medications or other home remedies. Denies other complaint or health concern today.   Patient Active Problem List   Diagnosis Date Noted  . Allergy history unknown 07/20/2011    Past Medical History  Diagnosis Date  . Allergy   . Cataract     History reviewed. No pertinent past surgical history.  History  Substance Use Topics  . Smoking status: Never Smoker   . Smokeless tobacco: Not on file  . Alcohol Use: Yes    Family History  Problem Relation Age of Onset  . COPD Mother     No Known Allergies  Medication list has been reviewed and updated.  Current Outpatient Prescriptions on File Prior to Visit  Medication Sig Dispense Refill  . ALPRAZolam (XANAX) 1 MG tablet Take 1 tablet (1 mg total) by mouth at bedtime as needed for sleep.  30 tablet  0  . azelastine (ASTELIN) 137 MCG/SPRAY nasal spray Place 1 spray into the nose 2 (two) times daily. Use in each nostril as directed  30 mL  12  . cetirizine (ZYRTEC) 10 MG tablet Take 1 tablet (10 mg total) by mouth daily.  90 tablet  3  . fluticasone (FLONASE) 50 MCG/ACT nasal spray Place 2 sprays into the nose daily.  16 g  6  . loratadine (CLARITIN) 10 MG tablet Take 1 tablet (10 mg total) by mouth daily.  90 tablet  3  . azithromycin (ZITHROMAX) 250 MG tablet Use as a zpack  6 tablet  0  . doxycycline (VIBRA-TABS)  100 MG tablet Take 1 tablet (100 mg total) by mouth 2 (two) times daily.  28 tablet  0  . HYDROcodone-acetaminophen (NORCO/VICODIN) 5-325 MG per tablet Take 1 tablet by mouth every 6 (six) hours as needed for pain.  30 tablet  0   Current Facility-Administered Medications on File Prior to Visit  Medication Dose Route Frequency Provider Last Rate Last Dose  . cefTRIAXone (ROCEPHIN) injection 1 g  1 g Intramuscular Once Jonita Albee, MD        Review of Systems:  As per HPI, otherwise negative.    Physical Examination: Filed Vitals:   10/31/12 0857  BP: 120/78  Pulse: 71  Temp: 98 F (36.7 C)  Resp: 16   Filed Vitals:   10/31/12 0857  Height: 6' (1.829 m)  Weight: 209 lb (94.802 kg)   Body mass index is 28.34 kg/(m^2). Ideal Body Weight: Weight in (lb) to have BMI = 25: 183.9  GEN: WDWN, NAD, Non-toxic, A & O x 3 HEENT: Atraumatic, Normocephalic. Neck supple. No masses, No LAD. Ears and Nose: No external deformity. CV: RRR, No M/G/R. No JVD. No thrill. No extra heart sounds. PULM: CTA B, no wheezes, crackles, rhonchi. No retractions. No resp. distress. No accessory muscle use. ABD: S, NT, ND, +  BS. No rebound. No HSM. EXTR: No c/c/e NEURO Normal gait.  PSYCH: Normally interactive. Conversant. Not depressed or anxious appearing.  Calm demeanor.    Assessment and Plan: Sinusitis augmentin xr mucinex d   Signed,  Phillips Odor, MD

## 2012-12-04 ENCOUNTER — Ambulatory Visit (INDEPENDENT_AMBULATORY_CARE_PROVIDER_SITE_OTHER): Payer: BC Managed Care – PPO | Admitting: Emergency Medicine

## 2012-12-04 ENCOUNTER — Ambulatory Visit: Payer: BC Managed Care – PPO

## 2012-12-04 DIAGNOSIS — H16001 Unspecified corneal ulcer, right eye: Secondary | ICD-10-CM

## 2012-12-04 DIAGNOSIS — H16009 Unspecified corneal ulcer, unspecified eye: Secondary | ICD-10-CM

## 2012-12-04 DIAGNOSIS — M79609 Pain in unspecified limb: Secondary | ICD-10-CM

## 2012-12-04 MED ORDER — HYDROCODONE-ACETAMINOPHEN 5-325 MG PO TABS: 1.0000 | ORAL_TABLET | ORAL | Status: DC | PRN

## 2012-12-04 MED ORDER — CIPROFLOXACIN HCL 0.3 % OP SOLN: 1.0000 [drp] | OPHTHALMIC | Status: DC

## 2012-12-04 NOTE — Patient Instructions (Addendum)
Corneal Ulcer A corneal ulcer is an open sore on the cornea. The cornea is the clear covering at the front and center of the eye.  CAUSES  Most corneal ulcers are caused by infections, such as a:  Bacterial infection. A bacterial infection can occur and cause a corneal ulcer if:  Contact lenses are worn too long (especially overnight) or are not properly cared for.  An eye injury occurs allowing bacteria to infect the area of injury.  Viral infection. A viral infection can occur and cause a corneal ulcer if:  The eye becomes infected with a virus, such as the herpes simplex (cold sore) virus, chickenpox virus, or shingles virus.  Fungal infection. A fungal infection can occur and cause a corneal ulcer if:  An eye injury resulted from contact with a plant or plant material.  An anti-inflammatory eyedrop is overused.  You have a weakened immune system.  Contact lenses are improperly cared for or become infected. Other causes:  Foreign bodies in the eye, such as sand, glass, or small pieces of glass or metal.  Dry eyes.  Certain disorders that prevent eyelids from closing completely, such as Bell's palsy.  Contact lenses, especially extended-wear soft contact lenses. Contact lenses can:  Scratch the cornea's surface, allowing bacteria to enter the scratch.  Trap dirt underneath the contact lens, which can scratch the cornea.  Harbor bacteria and fungi, making it more likely for bacterial infections to occur.  Block oxygen from the cornea, making it more likely for infections to occur. SYMPTOMS   Eye pain that is often severe.  Blurry vision  Light sensitivity.  Pus or thick discharge coming from the eye.  Eye redness.  Feeling like something is in the eye.  Watery or itchy eye.  Burning or stinging feeling. Some ulcers that are very big may be seen as a white spot on the cornea. DIAGNOSIS  An eye exam will be performed. Your caregiver may use a special kind of  microscope (slit lamp) to look at the cornea. Eyedrops may be put into the eye to make the ulcer easier to see. If it is suspected that an infection caused the corneal ulcer, tissue samples or cultures from the eye may be taken. Numbing eyedrops will be given before any samples or cultures are taken. The samples or cultures will be examined in the lab to check for bacteria, viruses, or fungi. TREATMENT  Treatment of the corneal ulcer depends on the cause. If your ulcer is severe, you may be given antibiotic eyedrops up until your caregiver knows the test results. Other treatments can include:  Antibacterial, antiviral, or antifungal eyedrops or ointment.  Removing the foreign body that caused the eye injury.  Artificial tears or a bandage contact lens if severe dry eyes caused the corneal ulcer.  Over-the-counter or prescription pain medicine.  Steroidal eyedrops if the eye is inflamed and swollen.  Antibiotic medicines by mouth.  An injection of medicine under the thin membrane covering the eyeball (conjunctiva). This allows medicine to reach the ulcer in high doses.  Eye patching to reduce irritation from blinking and bright light. An eye patch may not be given if the ulcer was caused by a bacterial infection. If the corneal ulcer causes a scar on the cornea that interferes with vision, hospitalization and surgery may be needed to replace the cornea (corneal transplant). HOME CARE INSTRUCTIONS   If prescribed, use your antibiotic pills, eyedrops, or ointment as directed. Continue using them even if you  start to feel better. You may have to apply eyedrops as often as every few minutes to every hour, for days. It may be necessary to set your alarm clock every few minutes to every hour during the night. This is absolutely necessary.  Only take over-the-counter or prescription medicines as directed by your caregiver.  Apply artificial tears as needed if you have dry eyes.  Do not touch or  rub your eye because this may increase the irritation and spread the infection.  Avoid wearing makeup.  Stay in a dark room and use sunglasses to reduce light sensitivity.  Apply cool packs to your eye to relieve discomfort and swelling.  If your eye is patched, you should not drive or use machinery. You will have reduced side vision and ability to judge distance.  Do not drive or operate machinery until approved by your caregiver. Your ability to judge distances is impaired.  Follow up with your caregiver as directed.  Do not wear contact lenses until your caregiver approves. If you normally wear contact lenses, follow these general rules to avoid the risk of a corneal ulcer:  Do not wear contact lenses while you sleep.  Wash your hands before removing contact lenses.  Properly sterilize and store your contact lenses.  Regularly clean your contact case.  Do not use your saliva or tap water to clean or wet your contact lenses.  Remove your contact lenses if your eye becomes irritated. You may put them back in once your eyes feel better. SEEK IMMEDIATE MEDICAL CARE IF:   You notice a change in your vision.  Your pain is getting worse, not better.  You have increasing discharge from the eye. Document Released: 03/25/2004 Document Revised: 11/10/2011 Document Reviewed: 08/31/2011 [facility] Patient Information 2014 [place], [place].

## 2012-12-04 NOTE — Progress Notes (Signed)
[facility] [address] [place] [address] [phone]  Date:  12/04/2012   Name:  Zachary Harrington   DOB:  03/18/1975   MRN:  [id number]  PCP:  No primary provider on file.    Chief Complaint: Eye Problem and Hand Injury   History of Present Illness:  Zachary Harrington is a 37 y.o. very pleasant male patient who presents with the following:  Two complaints.  Has pain in right wrist after basketball game.  Started following game.  No history of fall, injury or overuse.  Hurts to push off with his right hand.  Wore a new pair of contacts in Saturday and was not able to wear them on Sunday.  Now eye is red and draining.  Lenses are monthly wear that he only wears on weekends.  No ill contacts and no history of foreign body.  No visual symptoms.   No fever or chills, no headache.  No improvement with over the counter medications or other home remedies. Denies other complaint or health concern today.   Patient Active Problem List   Diagnosis Date Noted  . Allergy history unknown 07/20/2011    Past Medical History  Diagnosis Date  . Allergy   . Cataract     History reviewed. No pertinent past surgical history.  History  Substance Use Topics  . Smoking status: Never Smoker   . Smokeless tobacco: Not on file  . Alcohol Use: Yes    Family History  Problem Relation Age of Onset  . COPD Mother     No Known Allergies  Medication list has been reviewed and updated.  Current Outpatient Prescriptions on File Prior to Visit  Medication Sig Dispense Refill  . ALPRAZolam (XANAX) 1 MG tablet Take 1 tablet (1 mg total) by mouth at bedtime as needed for sleep.  30 tablet  0  . azelastine (ASTELIN) 137 MCG/SPRAY nasal spray Place 1 spray into the nose 2 (two) times daily. Use in each nostril as directed  30 mL  12  . cetirizine (ZYRTEC) 10 MG tablet Take 1 tablet (10 mg total) by mouth daily.  90 tablet  3  . loratadine (CLARITIN) 10 MG tablet Take 1 tablet  (10 mg total) by mouth daily.  90 tablet  3  . amoxicillin-clavulanate (AUGMENTIN XR) 1000-62.5 MG per tablet Take 2 tablets by mouth 2 (two) times daily.  40 tablet  0  . azithromycin (ZITHROMAX) 250 MG tablet Use as a zpack  6 tablet  0  . doxycycline (VIBRA-TABS) 100 MG tablet Take 1 tablet (100 mg total) by mouth 2 (two) times daily.  28 tablet  0  . fluticasone (FLONASE) 50 MCG/ACT nasal spray Place 2 sprays into the nose daily.  16 g  6  . HYDROcodone-acetaminophen (NORCO/VICODIN) 5-325 MG per tablet Take 1 tablet by mouth every 6 (six) hours as needed for pain.  30 tablet  0  . pseudoephedrine-guaifenesin (MUCINEX D) 60-600 MG per tablet Take 1 tablet by mouth every 12 (twelve) hours.  18 tablet  0   Current Facility-Administered Medications on File Prior to Visit  Medication Dose Route Frequency Provider Last Rate Last Dose  . cefTRIAXone (ROCEPHIN) injection 1 g  1 g Intramuscular Once Jonita Albee, MD        Review of Systems:  As per HPI, otherwise negative.    Physical Examination: Filed Vitals:   12/04/12 1346  BP: 120/80  Pulse: 62  Temp:  98.3 F (36.8 C)  Resp: 16   Filed Vitals:   12/04/12 1346  Height: 6' (1.829 m)  Weight: 199 lb (90.266 kg)   Body mass index is 26.98 kg/(m^2). Ideal Body Weight: Weight in (lb) to have BMI = 25: 183.9   GEN: WDWN, NAD, Non-toxic, Alert & Oriented x 3 HEENT: Atraumatic, Normocephalic.   PRRERLA EOMI right conjunctival injection.  Questionable ulceration left mid cornea.  No fluorescein uptake Ears and Nose: No external deformity. EXTR: No clubbing/cyanosis/edema NEURO: Normal gait.  PSYCH: Normally interactive. Conversant. Not depressed or anxious appearing.  Calm demeanor.  Right Hand and wrist.  Tender over ulnar aspect.  No ecchymosis or deformity  Assessment and Plan: Corneal ulcer cipro Eye follow up tomorrow   Signed,  Phillips Odor, MD   [facility] reading (PRIMARY) by  Dr. Dareen Piano  Hand:  Normal.  [facility]  reading (PRIMARY) by  Dr. Dareen Piano.  Wrist:  Unusual appearance for stat reading.

## 2013-02-13 ENCOUNTER — Other Ambulatory Visit: Payer: Self-pay | Admitting: Emergency Medicine

## 2013-06-17 ENCOUNTER — Ambulatory Visit (INDEPENDENT_AMBULATORY_CARE_PROVIDER_SITE_OTHER): Payer: BC Managed Care – PPO | Admitting: Family Medicine

## 2013-06-17 VITALS — BP 118/68 | HR 70 | Temp 97.8°F | Resp 16 | Ht 72.0 in | Wt 215.8 lb

## 2013-06-17 DIAGNOSIS — T148XXA Other injury of unspecified body region, initial encounter: Secondary | ICD-10-CM

## 2013-06-17 DIAGNOSIS — Z9109 Other allergy status, other than to drugs and biological substances: Secondary | ICD-10-CM

## 2013-06-17 DIAGNOSIS — R079 Chest pain, unspecified: Secondary | ICD-10-CM

## 2013-06-17 DIAGNOSIS — Z889 Allergy status to unspecified drugs, medicaments and biological substances status: Secondary | ICD-10-CM

## 2013-06-17 DIAGNOSIS — R0781 Pleurodynia: Secondary | ICD-10-CM

## 2013-06-17 MED ORDER — AZELASTINE HCL 0.1 % NA SOLN: 1.0000 | Freq: Two times a day (BID) | NASAL | Status: DC

## 2013-06-17 MED ORDER — HYDROCODONE-ACETAMINOPHEN 5-325 MG PO TABS: 1.0000 | ORAL_TABLET | Freq: Three times a day (TID) | ORAL | Status: DC | PRN

## 2013-06-17 MED ORDER — CYCLOBENZAPRINE HCL 5 MG PO TABS: 5.0000 mg | ORAL_TABLET | Freq: Every evening | ORAL | Status: DC | PRN

## 2013-06-17 NOTE — Progress Notes (Signed)
Chief Complaint:  Chief Complaint  Patient presents with  . Flank Pain    noticed after playing softball yesturday ; has tried aleve   . sinus pain  . congestion    HPI: Zachary Harrington is a 38 y.o. male who is here for left sided rib pain  Since yesterday after playing softball , he felt something pull when he went to bat. He has 8-9/10 pain, has tried Aleve without relief. He has pain when he takes a deep breath or moves. He denies SOB, palpitations, IT feels all muscular and not in his lungs. When he sneezes it really hurts. No hx of pneumothorax, deneis SOB.   Sinus are bothering him. He has tried zyrtec and claritin alternating, he does not use flonase because does not work for him. He takes astelin but ran out and that helps. NO fevers, chills, cough, drainage , ear pain, facial pain  Past Medical History  Diagnosis Date  . Allergy   . Cataract    History reviewed. No pertinent past surgical history. History   Social History  . Marital Status: Married    Spouse Name: N/A    Number of Children: N/A  . Years of Education: N/A   Social History Main Topics  . Smoking status: Never Smoker   . Smokeless tobacco: None  . Alcohol Use: Yes  . Drug Use: No  . Sexual Activity: Yes    Birth Control/ Protection: None   Other Topics Concern  . None   Social History Narrative  . None   Family History  Problem Relation Age of Onset  . COPD Mother    No Known Allergies Prior to Admission medications   Medication Sig Start Date End Date Taking? Authorizing Provider  ALPRAZolam Prudy Feeler(XANAX) 1 MG tablet Take 1 tablet (1 mg total) by mouth at bedtime as needed for sleep. 07/27/12  Yes Gwenlyn FoundJessica C Copland, MD  azelastine (ASTELIN) 137 MCG/SPRAY nasal spray Place 1 spray into the nose 2 (two) times daily. Use in each nostril as directed 04/23/12  Yes Jonita Albeehris W Guest, MD  cetirizine (ZYRTEC) 10 MG tablet Take 1 tablet (10 mg total) by mouth daily. 04/23/12  Yes Jonita Albeehris W Guest, MD    fluticasone [facility](FLONASE) 50 MCG/ACT nasal spray Place 2 sprays into the nose daily. 04/23/12  Yes Jonita Albeehris W Guest, MD  loratadine (CLARITIN) 10 MG tablet Take 1 tablet (10 mg total) by mouth daily. 04/23/12  Yes Jonita Albeehris W Guest, MD  amoxicillin-clavulanate (AUGMENTIN XR) 1000-62.5 MG per tablet Take 2 tablets by mouth 2 (two) times daily. 10/31/12   Phillips OdorJeffery Anderson, MD  azithromycin (ZITHROMAX) 250 MG tablet Use as a zpack 07/27/12   Gwenlyn FoundJessica C Copland, MD  ciprofloxacin (CILOXAN) 0.3 % ophthalmic solution Place 1 drop into the right eye every 2 (two) hours. Administer 1 drop, every 2 hours, while awake, for 2 days. Then 1 drop, every 4 hours, while awake, for the next 5 days. 12/04/12   Phillips OdorJeffery Anderson, MD  doxycycline (VIBRA-TABS) 100 MG tablet Take 1 tablet (100 mg total) by mouth 2 (two) times daily. 09/28/12   Jonita Albeehris W Guest, MD  HYDROcodone-acetaminophen (NORCO) 5-325 MG per tablet Take 1-2 tablets by mouth every 4 (four) hours as needed for pain. 12/04/12   Phillips OdorJeffery Anderson, MD  HYDROcodone-acetaminophen (NORCO/VICODIN) 5-325 MG per tablet Take 1 tablet by mouth every 6 (six) hours as needed for pain. 09/28/12   Jonita Albeehris W Guest, MD  pseudoephedrine-guaifenesin [facility](MUCINEX D) 60-600  MG per tablet Take 1 tablet by mouth every 12 (twelve) hours. 10/31/12 10/31/13  Phillips OdorJeffery Anderson, MD     ROS: The patient denies fevers, chills, night sweats, unintentional weight loss, chest pain, palpitations, wheezing, dyspnea on exertion, nausea, vomiting, abdominal pain, dysuria, hematuria, melena, numbness, weakness, or tingling.   All other systems have been reviewed and were otherwise negative with the exception of those mentioned in the HPI and as above.    PHYSICAL EXAM: Filed Vitals:   06/17/13 0831  BP: 118/68  Pulse: 70  Temp: 97.8 F (36.6 C)  Resp: 16   Filed Vitals:   06/17/13 0831  Height: 6' (1.829 m)  Weight: 215 lb 12.8 oz (97.886 kg)   Body mass index is 29.26 kg/(m^2).  General: Alert, no acute  distress HEENT:  Normocephalic, atraumatic, oropharynx patent. EOMI, PERRLA, boggy nares.  Cardiovascular:  Regular rate and rhythm, no rubs murmurs or gallops.  No Carotid bruits, radial pulse intact. No pedal edema.  Respiratory: Clear to auscultation bilaterally.  No wheezes, rales, or rhonchi.  No cyanosis, no use of accessory musculature GI: No organomegaly, abdomen is soft and non-tender, positive bowel sounds.  No masses. Skin: No rashes. Neurologic: Facial musculature symmetric. Psychiatric: Patient is appropriate throughout our interaction. Lymphatic: No cervical lymphadenopathy Musculoskeletal: Gait intact. + more msk tenderness then rib tenderness on palpation of let side Full ROM but painful Sensation intact    LABS: Results for orders placed in visit on 09/28/12  WOUND CULTURE      Result Value Ref Range   Culture       Value: Abundant METHICILLIN RESISTANT STAPHYLOCOCCUS AUREUS   Gram Stain Rare     Gram Stain WBC present-predominately PMN     Gram Stain No Squamous Epithelial Cells Seen     Gram Stain Rare Gram Positive Cocci In Pairs     Organism ID, Bacteria METHICILLIN RESISTANT STAPHYLOCOCCUS AUREUS       EKG/XRAY:   [facility]   ASSESSMENT/PLAN: Encounter Diagnoses  Name Primary?  Marland Kitchen. Hx of seasonal allergies Yes  . Sprain and strain   . Rib pain on left side    Rx Flexeril Rx Norco Rx Astelin F/u prn Declined Xray at thsi time, both he and I feel it is more msk then ribs since he had no personal contact, he just twisted to bat and did it too forcefully Will return for xrays if worsenign sxs ro SOB  Gross sideeffects, risk and benefits, and alternatives of medications d/w patient. Patient is aware that all medications have potential sideeffects and we are unable to predict every sideeffect or drug-drug interaction that may occur.  Lenell Antuhao P Jennings Corado, DO 06/19/2013 12:07 PM

## 2013-08-22 ENCOUNTER — Other Ambulatory Visit: Payer: Self-pay | Admitting: Family

## 2013-08-22 ENCOUNTER — Ambulatory Visit
Admission: RE | Admit: 2013-08-22 | Discharge: 2013-08-22 | Disposition: A | Discharge status: Home / Self Care | Payer: Federal, State, Local not specified - PPO | Source: Ambulatory Visit | Attending: Family | Admitting: Family

## 2013-08-22 DIAGNOSIS — M25562 Pain in left knee: Secondary | ICD-10-CM

## 2014-04-26 ENCOUNTER — Other Ambulatory Visit: Payer: Self-pay | Admitting: Orthopaedic Surgery

## 2014-04-26 DIAGNOSIS — M25512 Pain in left shoulder: Secondary | ICD-10-CM

## 2014-05-09 ENCOUNTER — Other Ambulatory Visit: Payer: Federal, State, Local not specified - PPO

## 2014-08-30 ENCOUNTER — Other Ambulatory Visit: Payer: Self-pay | Admitting: Family Medicine

## 2014-08-30 IMAGING — CR DG KNEE COMPLETE 4+V*L*
4 series · 4 of 4 positions shown · non-contrast
Comparison: 06/30/2004

CLINICAL DATA: Pain with flexion when going down stairs

EXAM:
LEFT KNEE - COMPLETE 4+ VIEW

[view not recorded (1 of 4)]
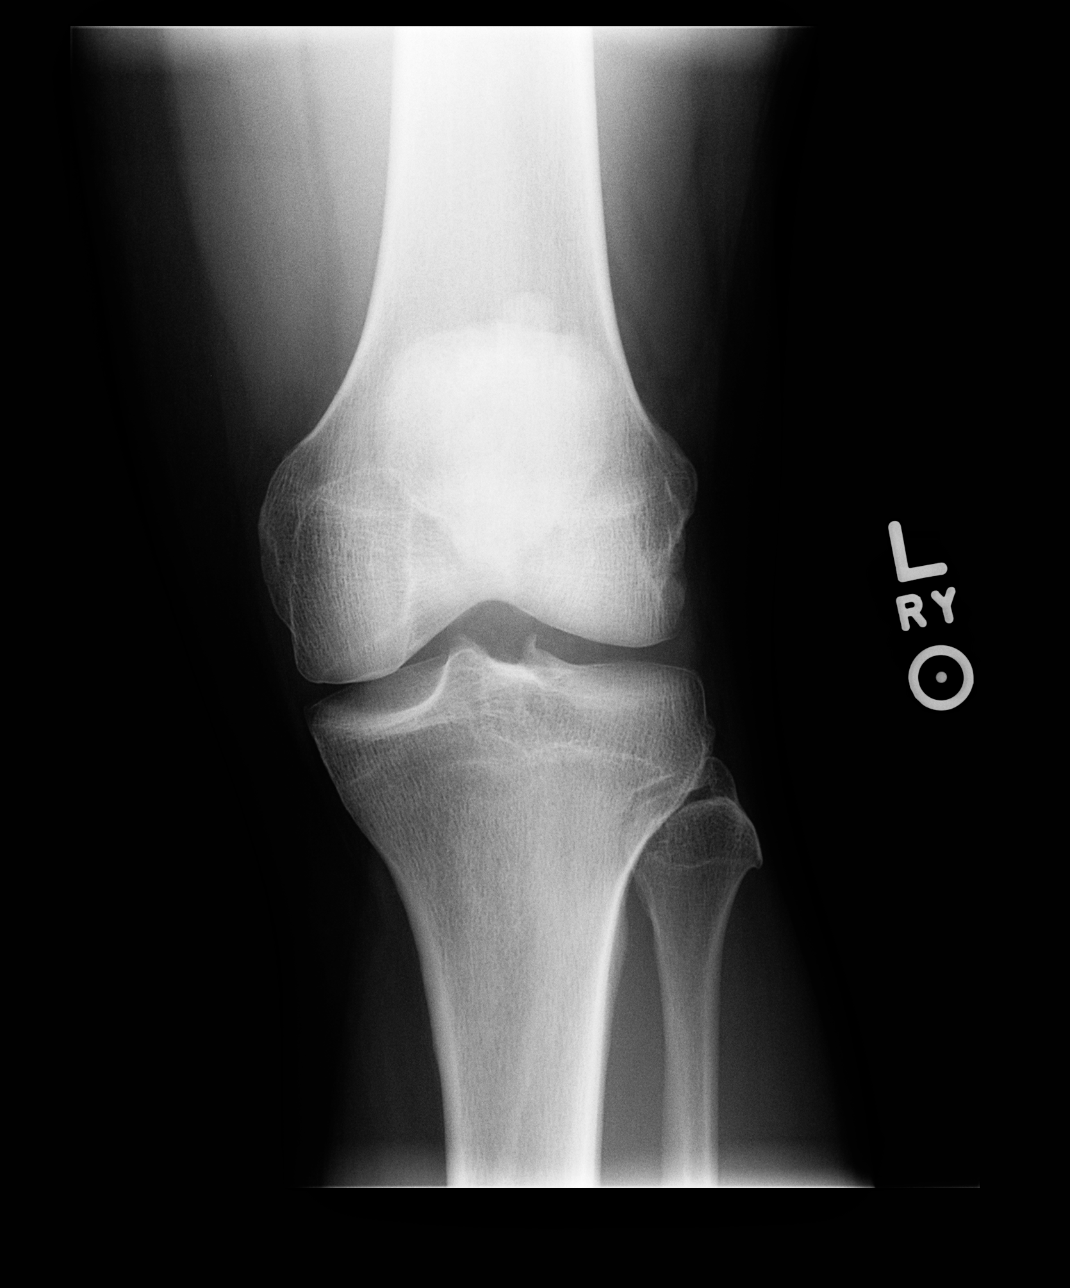

[view not recorded (2 of 4)]
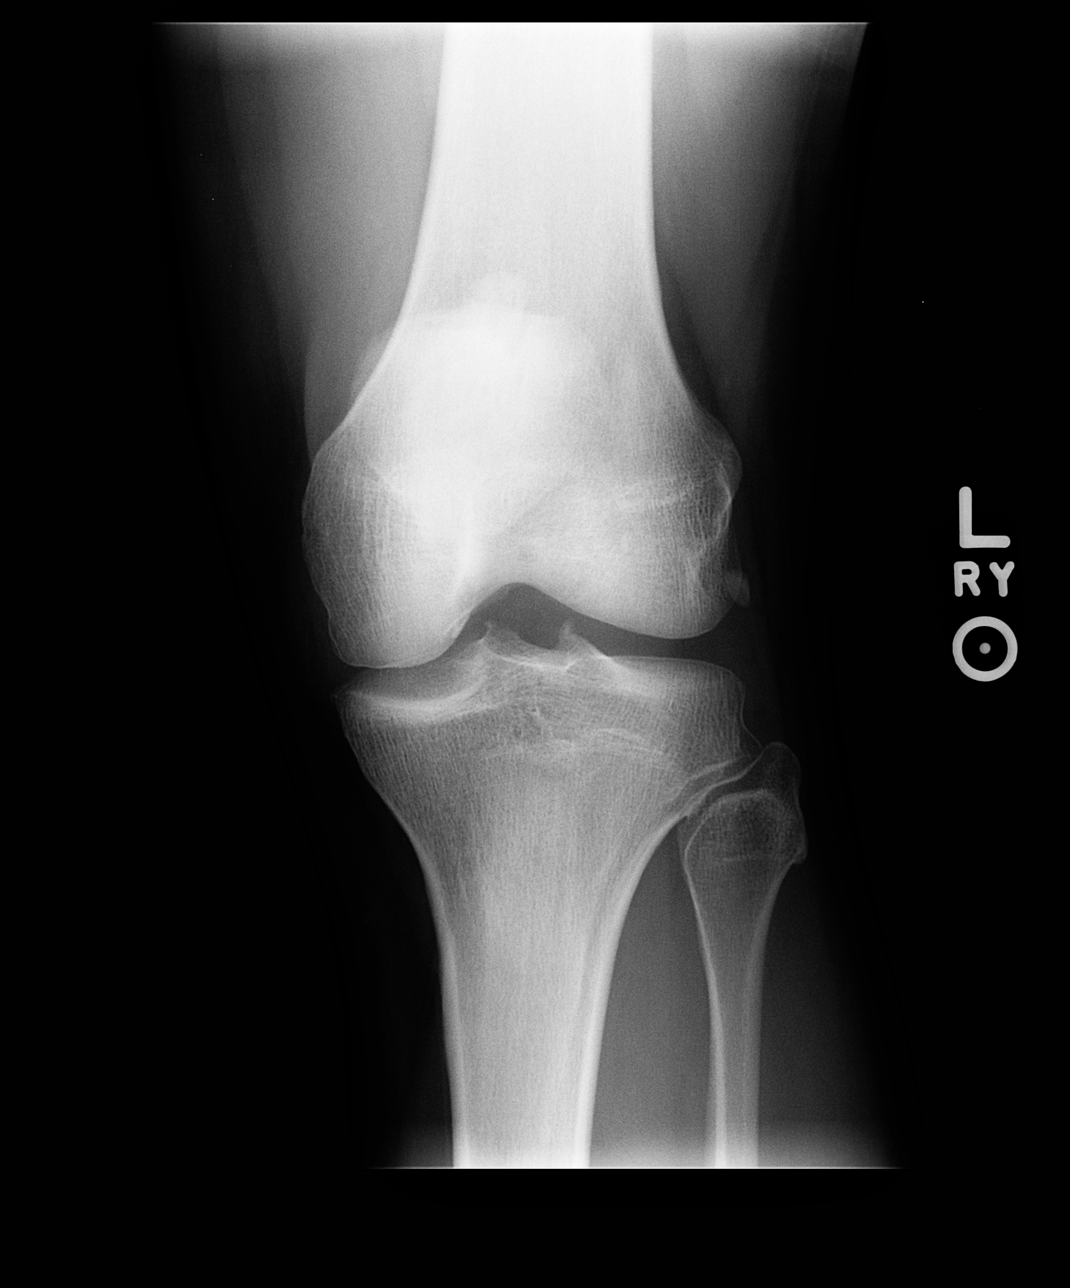

[view not recorded (3 of 4)]
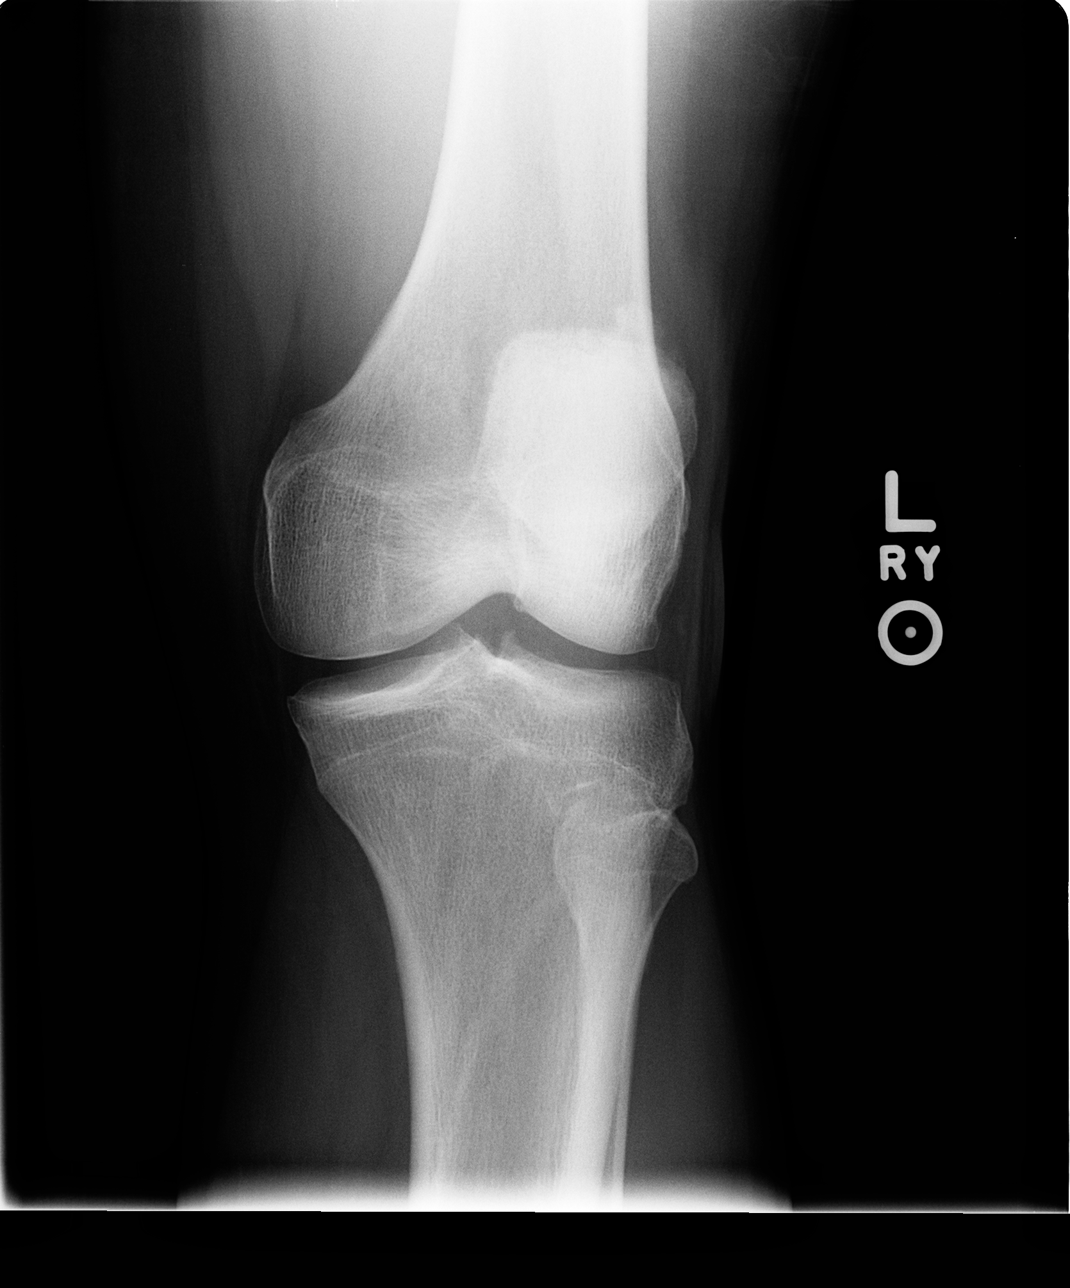

[view not recorded (4 of 4)]
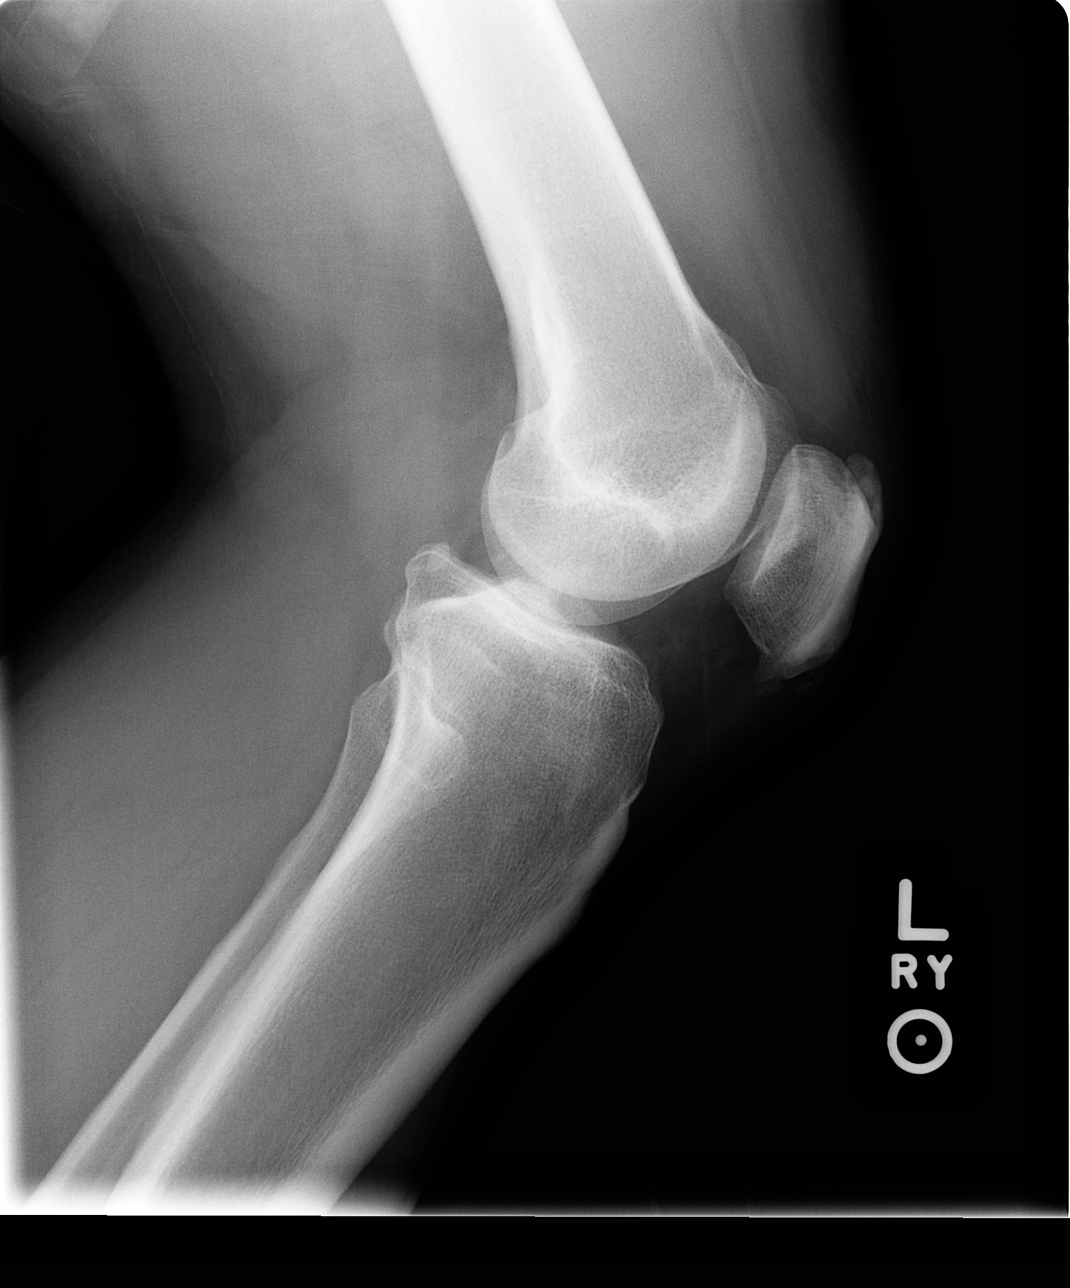

[4 of 4 positions shown; findings below may reference images not displayed]

FINDINGS: Four views of left knee submitted. No acute fracture or subluxation.
Minimal narrowing of medial joint compartment. Mild narrowing of
patellofemoral joint space. Spurring of patella.
IMPRESSION: No acute fracture or subluxation.  Minimal degenerative changes.

## 2014-10-28 ENCOUNTER — Other Ambulatory Visit: Payer: Self-pay | Admitting: Family

## 2014-10-28 ENCOUNTER — Ambulatory Visit
Admission: RE | Admit: 2014-10-28 | Discharge: 2014-10-28 | Disposition: A | Discharge status: Home / Self Care | Payer: Federal, State, Local not specified - PPO | Source: Ambulatory Visit | Attending: Family | Admitting: Family

## 2014-10-28 DIAGNOSIS — M25531 Pain in right wrist: Secondary | ICD-10-CM

## 2014-11-01 ENCOUNTER — Other Ambulatory Visit: Payer: Self-pay | Admitting: Orthopedic Surgery

## 2014-11-01 DIAGNOSIS — M25531 Pain in right wrist: Secondary | ICD-10-CM

## 2014-11-13 ENCOUNTER — Other Ambulatory Visit: Payer: Federal, State, Local not specified - PPO

## 2014-11-29 ENCOUNTER — Ambulatory Visit
Admission: RE | Admit: 2014-11-29 | Discharge: 2014-11-29 | Disposition: A | Discharge status: Home / Self Care | Payer: Federal, State, Local not specified - PPO | Source: Ambulatory Visit | Attending: Orthopedic Surgery | Admitting: Orthopedic Surgery

## 2014-11-29 DIAGNOSIS — M25531 Pain in right wrist: Secondary | ICD-10-CM

## 2014-11-29 MED ORDER — IOHEXOL 180 MG/ML  SOLN
2.0000 mL | Freq: Once | INTRAMUSCULAR | Status: DC | PRN
  Administered 2014-11-29: 2 mL via INTRA_ARTICULAR

## 2015-12-23 DIAGNOSIS — Z113 Encounter for screening for infections with a predominantly sexual mode of transmission: Secondary | ICD-10-CM | POA: Diagnosis not present

## 2016-03-26 DIAGNOSIS — M25561 Pain in right knee: Secondary | ICD-10-CM | POA: Diagnosis not present

## 2016-03-26 DIAGNOSIS — M25562 Pain in left knee: Secondary | ICD-10-CM | POA: Diagnosis not present

## 2016-05-20 ENCOUNTER — Encounter (HOSPITAL_COMMUNITY): Payer: Self-pay

## 2016-05-20 ENCOUNTER — Emergency Department (HOSPITAL_COMMUNITY)
Admission: EM | Admit: 2016-05-20 | Discharge: 2016-05-20 | Disposition: A | Discharge status: Home / Self Care | Payer: Federal, State, Local not specified - PPO | Attending: Emergency Medicine | Admitting: Emergency Medicine

## 2016-05-20 DIAGNOSIS — Y929 Unspecified place or not applicable: Secondary | ICD-10-CM | POA: Diagnosis not present

## 2016-05-20 DIAGNOSIS — Y999 Unspecified external cause status: Secondary | ICD-10-CM | POA: Diagnosis not present

## 2016-05-20 DIAGNOSIS — Z23 Encounter for immunization: Secondary | ICD-10-CM | POA: Diagnosis not present

## 2016-05-20 DIAGNOSIS — S0181XA Laceration without foreign body of other part of head, initial encounter: Secondary | ICD-10-CM | POA: Insufficient documentation

## 2016-05-20 DIAGNOSIS — W500XXA Accidental hit or strike by another person, initial encounter: Secondary | ICD-10-CM | POA: Insufficient documentation

## 2016-05-20 DIAGNOSIS — Z79899 Other long term (current) drug therapy: Secondary | ICD-10-CM | POA: Diagnosis not present

## 2016-05-20 DIAGNOSIS — Y9367 Activity, basketball: Secondary | ICD-10-CM | POA: Insufficient documentation

## 2016-05-20 MED ORDER — LIDOCAINE HCL 2 % IJ SOLN
20.0000 mL | Freq: Once | INTRAMUSCULAR | Status: AC
  Administered 2016-05-20: 400 mg
  Filled 2016-05-20: qty 20

## 2016-05-20 MED ORDER — TETANUS-DIPHTH-ACELL PERTUSSIS 5-2.5-18.5 LF-MCG/0.5 IM SUSP
0.5000 mL | Freq: Once | INTRAMUSCULAR | Status: AC
  Administered 2016-05-20: 0.5 mL via INTRAMUSCULAR
  Filled 2016-05-20: qty 0.5

## 2016-05-20 NOTE — Discharge Instructions (Signed)
Keep the area clean and dry.  Return here as needed. 

## 2016-05-20 NOTE — ED Provider Notes (Signed)
[facility]-EMERGENCY DEPT Provider Note   CSN: [id number] Arrival date & time: 05/20/16  1939    By signing my name below, I, Zachary Harrington, attest that this documentation has been prepared under the direction and in the presence of BoeingChris Serapio Edelson, PA-C. Electronically Signed: Valentino SaxonBianca Harrington, ED Scribe. 05/20/16. 9:14 PM.  History   Chief Complaint Chief Complaint  Patient presents with  . Facial Laceration   The history is provided by the patient. No language interpreter was used.   HPI Comments: Zachary Harrington is a 41 y.o. male who presents to the Emergency Department presenting with sudden onset, facial laceration that occurred earlier today at ~6:30pm. Pt notes he was playing defensive in a game of basketball, when he was suddenly struck in the face, above the bridge of his nose, by an opponent's elbow. He denies LOC and/or any additional injuries. Pt reports associated bleeding to site, but notes it has been brought under control with a washcloth. Pt is unsure when his last T-dap shot was administered. He denies fever and headache. No additional complaints at this time.   Past Medical History:  Diagnosis Date  . Allergy     Patient Active Problem List   Diagnosis Date Noted  . Allergy history unknown 07/20/2011    History reviewed. No pertinent surgical history.     Home Medications    Prior to Admission medications   Medication Sig Start Date End Date Taking? Authorizing Provider  ALPRAZolam Prudy Feeler(XANAX) 1 MG tablet Take 1 tablet (1 mg total) by mouth at bedtime as needed for sleep. 07/27/12   Gwenlyn FoundJessica C Copland, MD  azelastine (ASTELIN) 0.1 % nasal spray Place 1 spray into both nostrils 2 (two) times daily. PATIENT NEEDS OFFICE VISIT FOR ADDITIONAL REFILLS 09/02/14   Porfirio Oarhelle Jeffery, PA-C  cetirizine (ZYRTEC) 10 MG tablet Take 1 tablet (10 mg total) by mouth daily. 04/23/12   Jonita Albeehris W Guest, MD  cyclobenzaprine (FLEXERIL) 5 MG tablet Take 1 tablet (5 mg total) by mouth at  bedtime as needed. 06/17/13   Thao P Le, DO  HYDROcodone-acetaminophen (NORCO) 5-325 MG per tablet Take 1-2 tablets by mouth every 8 (eight) hours as needed. 06/17/13   Thao P Le, DO  loratadine (CLARITIN) 10 MG tablet Take 1 tablet (10 mg total) by mouth daily. 04/23/12   Jonita Albeehris W Guest, MD    Family History Family History  Problem Relation Age of Onset  . COPD Mother     Social History Social History  Substance Use Topics  . Smoking status: Never Smoker  . Smokeless tobacco: Not on file  . Alcohol use Yes     Allergies   Patient has no known allergies.   Review of Systems Review of Systems  Constitutional: Negative for fever.  Skin: Positive for wound.  Neurological: Negative for syncope and headaches.  All other systems reviewed and are negative.    Physical Exam Updated Vital Signs BP (!) 164/101 (BP Location: Right Arm)   Pulse 88   Temp 98.3 F (36.8 C) (Oral)   Resp 16   Ht 6' (1.829 m)   Wt 220 lb (99.8 kg)   SpO2 98%   BMI 29.84 kg/m   Physical Exam  Constitutional: He is oriented to person, place, and time. He appears well-developed and well-nourished. No distress.  HENT:  Head: Normocephalic and atraumatic.  Eyes: Pupils are equal, round, and reactive to light.  Pulmonary/Chest: Effort normal.  Neurological: He is alert and oriented to person, place, and  time.  Skin: Skin is warm and dry.  3 cm linear to laceration to space between the eyebrows. Bleeding controlled.   Psychiatric: He has a normal mood and affect.  Nursing note and vitals reviewed.    ED Treatments / Results   DIAGNOSTIC STUDIES: Oxygen Saturation is 98% on RA, normal by my interpretation.    COORDINATION OF CARE: 8:00 PM Discussed treatment plan with pt at bedside which includes laceration repair and T-dap update and pt agreed to plan.  Labs (all labs ordered are listed, but only abnormal results are displayed) Labs Reviewed - No data to display  EKG  EKG  Interpretation None       Radiology No results found.  Procedures .Marland KitchenLaceration Repair Date/Time: 05/20/2016 9:15 PM Performed by: Charlestine Night Authorized by: Benjiman Core   Consent:    Consent obtained:  Verbal   Consent given by:  Patient Laceration details:    Location:  Face   Length (cm):  3 Pre-procedure details:    Preparation:  Patient was prepped and draped in usual sterile fashion Exploration:    Hemostasis achieved with:  Direct pressure   Wound exploration: entire depth of wound probed and visualized     Contaminated: no   Skin repair:    Repair method:  Sutures   Suture size:  6-0   Suture material:  Prolene   Suture technique:  Simple interrupted   Number of sutures:  8 Approximation:    Approximation:  Close Post-procedure details:    Dressing:  Open (no dressing)   Patient tolerance of procedure:  Tolerated well, no immediate complications    (including critical care time)  Medications Ordered in ED Medications  Tdap (BOOSTRIX) injection 0.5 mL (not administered)  lidocaine (XYLOCAINE) 2 % (with pres) injection 400 mg (not administered)     Initial Impression / Assessment and Plan / ED Course  I have reviewed the triage vital signs and the nursing notes.  Pertinent labs & imaging results that were available during my care of the patient were reviewed by me and considered in my medical decision making (see chart for details).      Tetanus updated in ED. Laceration occurred < 12 hours prior to repair. Discussed laceration care with pt and answered questions. Pt to f-u for suture removal in 3-5 days and wound check sooner should there be signs of dehiscence or infection. Pt is hemodynamically stable with no complaints prior to dc.  Patient is advised that scar reduction techniques.  Told to return here as needed for any worsening in his condition  Final Clinical Impressions(s) / ED Diagnoses   Final diagnoses:  None    New  Prescriptions New Prescriptions   No medications on file   I personally performed the services described in this documentation, which was scribed in my presence. The recorded information has been reviewed and is accurate.    Charlestine Night, PA-C 05/20/16 2123    Benjiman Core, MD 05/22/16 [phone]

## 2016-05-20 NOTE — ED Triage Notes (Signed)
Pt presents with c/o laceration to the bridge of his nose after receiving an elbow to his face while playing basketball. Bleeding is controlled at this time.

## 2016-09-15 ENCOUNTER — Encounter (HOSPITAL_COMMUNITY): Payer: Self-pay | Admitting: Emergency Medicine

## 2016-09-15 ENCOUNTER — Ambulatory Visit (HOSPITAL_COMMUNITY)
Admission: EM | Admit: 2016-09-15 | Discharge: 2016-09-15 | Disposition: A | Discharge status: Home / Self Care | Payer: Federal, State, Local not specified - PPO | Attending: Internal Medicine | Admitting: Internal Medicine

## 2016-09-15 DIAGNOSIS — B349 Viral infection, unspecified: Secondary | ICD-10-CM

## 2016-09-15 DIAGNOSIS — J029 Acute pharyngitis, unspecified: Secondary | ICD-10-CM | POA: Diagnosis not present

## 2016-09-15 LAB — POCT RAPID STREP A: STREPTOCOCCUS, GROUP A SCREEN (DIRECT): NEGATIVE

## 2016-09-15 MED ORDER — IBUPROFEN 800 MG PO TABS
ORAL_TABLET | ORAL | Status: AC
  Filled 2016-09-15: qty 1

## 2016-09-15 MED ORDER — AMOXICILLIN 500 MG PO CAPS: 1000.0000 mg | ORAL_CAPSULE | Freq: Two times a day (BID) | ORAL | 0 refills | Status: DC

## 2016-09-15 MED ORDER — IBUPROFEN 800 MG PO TABS
800.0000 mg | ORAL_TABLET | Freq: Once | ORAL | Status: AC
  Administered 2016-09-15: 800 mg via ORAL

## 2016-09-15 NOTE — ED Provider Notes (Signed)
CSN: [id number]     Arrival date & time 09/15/16  1942 History   First MD Initiated Contact with Patient 09/15/16 2030     Chief Complaint  Patient presents with  . Sore Throat  . Fever   (Consider location/radiation/quality/duration/timing/severity/associated sxs/prior Treatment) 41 year old male complaining of fever, body aches, sore throat, headache and myalgias since last night. He vomited once several hours ago but not since and has no current nausea. Temperature on arrival 102.2.      Past Medical History:  Diagnosis Date  . Allergy    History reviewed. No pertinent surgical history. Family History  Problem Relation Age of Onset  . COPD Mother    Social History  Substance Use Topics  . Smoking status: Never Smoker  . Smokeless tobacco: Not on file  . Alcohol use Yes    Review of Systems  Constitutional: Positive for activity change, fatigue and fever.  HENT: Positive for sore throat. Negative for congestion, ear pain, facial swelling, postnasal drip and rhinorrhea.   Respiratory: Negative.   Gastrointestinal:       As per history of present illness  Genitourinary: Negative.   Musculoskeletal: Positive for back pain and myalgias.  Neurological: Negative.   All other systems reviewed and are negative.   Allergies  Patient has no known allergies.  Home Medications   Prior to Admission medications   Medication Sig Start Date End Date Taking? Authorizing Provider  ALPRAZolam Prudy Feeler) 1 MG tablet Take 1 tablet (1 mg total) by mouth at bedtime as needed for sleep. 07/27/12   Copland, Gwenlyn Found, MD  amoxicillin (AMOXIL) 500 MG capsule Take 2 capsules (1,000 mg total) by mouth 2 (two) times daily. 09/15/16   Hayden Rasmussen, NP  azelastine (ASTELIN) 0.1 % nasal spray Place 1 spray into both nostrils 2 (two) times daily. PATIENT NEEDS OFFICE VISIT FOR ADDITIONAL REFILLS 09/02/14   Porfirio Oar, PA-C  cetirizine (ZYRTEC) 10 MG tablet Take 1 tablet (10 mg total) by mouth  daily. 04/23/12   Jonita Albee, MD  cyclobenzaprine (FLEXERIL) 5 MG tablet Take 1 tablet (5 mg total) by mouth at bedtime as needed. 06/17/13   Le, Thao P, DO  HYDROcodone-acetaminophen (NORCO) 5-325 MG per tablet Take 1-2 tablets by mouth every 8 (eight) hours as needed. 06/17/13   Le, Thao P, DO  loratadine (CLARITIN) 10 MG tablet Take 1 tablet (10 mg total) by mouth daily. 04/23/12   Jonita Albee, MD   Meds Ordered and Administered this Visit   Medications  ibuprofen (ADVIL,MOTRIN) tablet 800 mg (800 mg Oral Given 09/15/16 2002)    BP (!) 148/86   Pulse (!) 104   Temp (!) 102 F (38.9 C) (Oral)   Resp 16   Ht 6' (1.829 m)   Wt 215 lb (97.5 kg)   SpO2 98%   BMI 29.16 kg/m  No data found.   Physical Exam  Constitutional: He is oriented to person, place, and time. He appears well-developed and well-nourished.  HENT:  Head: Normocephalic and atraumatic.  Bilateral TMs are normal. Oropharynx with deep red smooth mucosa. Few small exudates.  Eyes: EOM are normal.  Neck: Normal range of motion. Neck supple.  Cardiovascular: Normal rate and regular rhythm.   Pulmonary/Chest: Effort normal. No respiratory distress. He has no wheezes. He has rales.  Abdominal: Soft. There is no tenderness.  Lymphadenopathy:    He has no cervical adenopathy.  Neurological: He is alert and oriented to person, place, and time.  Skin: Skin is warm and dry. No rash noted.  Psychiatric: He has a normal mood and affect.  Nursing note and vitals reviewed.   Urgent Care Course     Procedures (including critical care time)  Labs Review Labs Reviewed  POCT RAPID STREP A   Results for orders placed or performed during the hospital encounter of 09/15/16  POCT rapid strep A [facility]  Result Value Ref Range   Streptococcus, Group A Screen (Direct) NEGATIVE NEGATIVE     Imaging Review No results found.   Visual Acuity Review  Right Eye Distance:   Left Eye Distance:   Bilateral  Distance:    Right Eye Near:   Left Eye Near:    Bilateral Near:         MDM   1. Sore throat   2. Exudative pharyngitis   3. Viral illness    Cepacol lozenges for sore throat pain. Ibuprofen 600 mg every 6 hours as needed. Amoxicillin as directed. Drink plenty fluids and stay well-hydrated. No work or increase in physical activity in 2-3 days. Rest. Meds ordered this encounter  Medications  . ibuprofen (ADVIL,MOTRIN) tablet 800 mg  . amoxicillin (AMOXIL) 500 MG capsule    Sig: Take 2 capsules (1,000 mg total) by mouth 2 (two) times daily.    Dispense:  40 capsule    Refill:  0    Order Specific Question:   Supervising Provider    Answer:   Eustace MooreMURRAY, LAURA W [[id number]]       Hayden RasmussenMabe, Jacquelyn Antony, NP 09/15/16 2048

## 2016-09-15 NOTE — Discharge Instructions (Signed)
Cepacol lozenges for sore throat pain. Ibuprofen 600 mg every 6 hours as needed. Amoxicillin as directed. Drink plenty fluids and stay well-hydrated. No work or increase in physical activity in 2-3 days. Rest.

## 2016-09-15 NOTE — ED Triage Notes (Signed)
PT reports fever, sore throat, body aches, and nausea that started yesterday. Last dose of meds was tylenol at 11am today.

## 2016-09-17 LAB — CULTURE, GROUP A STREP (THRC)

## 2017-01-12 DIAGNOSIS — Z23 Encounter for immunization: Secondary | ICD-10-CM | POA: Diagnosis not present

## 2017-01-12 DIAGNOSIS — Z125 Encounter for screening for malignant neoplasm of prostate: Secondary | ICD-10-CM | POA: Diagnosis not present

## 2017-01-12 DIAGNOSIS — Z131 Encounter for screening for diabetes mellitus: Secondary | ICD-10-CM | POA: Diagnosis not present

## 2017-01-12 DIAGNOSIS — Z0000 Encounter for general adult medical examination without abnormal findings: Secondary | ICD-10-CM | POA: Diagnosis not present

## 2017-01-12 DIAGNOSIS — Z13220 Encounter for screening for lipoid disorders: Secondary | ICD-10-CM | POA: Diagnosis not present

## 2017-02-28 DIAGNOSIS — G4733 Obstructive sleep apnea (adult) (pediatric): Secondary | ICD-10-CM | POA: Diagnosis not present

## 2017-03-16 DIAGNOSIS — G4733 Obstructive sleep apnea (adult) (pediatric): Secondary | ICD-10-CM | POA: Diagnosis not present

## 2017-04-08 DIAGNOSIS — K37 Unspecified appendicitis: Secondary | ICD-10-CM | POA: Diagnosis not present

## 2017-04-08 DIAGNOSIS — R1030 Lower abdominal pain, unspecified: Secondary | ICD-10-CM | POA: Diagnosis not present

## 2017-04-12 ENCOUNTER — Other Ambulatory Visit: Payer: Self-pay

## 2017-04-12 ENCOUNTER — Emergency Department (HOSPITAL_COMMUNITY)
Admission: EM | Admit: 2017-04-12 | Discharge: 2017-04-12 | Disposition: A | Discharge status: Home / Self Care | Payer: Federal, State, Local not specified - PPO | Attending: Emergency Medicine | Admitting: Emergency Medicine

## 2017-04-12 ENCOUNTER — Encounter (HOSPITAL_COMMUNITY): Payer: Self-pay

## 2017-04-12 ENCOUNTER — Emergency Department (HOSPITAL_COMMUNITY): Payer: Federal, State, Local not specified - PPO

## 2017-04-12 DIAGNOSIS — R935 Abnormal findings on diagnostic imaging of other abdominal regions, including retroperitoneum: Secondary | ICD-10-CM | POA: Diagnosis not present

## 2017-04-12 DIAGNOSIS — R9389 Abnormal findings on diagnostic imaging of other specified body structures: Secondary | ICD-10-CM | POA: Diagnosis not present

## 2017-04-12 DIAGNOSIS — Z79899 Other long term (current) drug therapy: Secondary | ICD-10-CM | POA: Diagnosis not present

## 2017-04-12 DIAGNOSIS — R109 Unspecified abdominal pain: Secondary | ICD-10-CM | POA: Insufficient documentation

## 2017-04-12 DIAGNOSIS — R1084 Generalized abdominal pain: Secondary | ICD-10-CM | POA: Diagnosis not present

## 2017-04-12 DIAGNOSIS — R1110 Vomiting, unspecified: Secondary | ICD-10-CM | POA: Diagnosis not present

## 2017-04-12 LAB — URINALYSIS, ROUTINE W REFLEX MICROSCOPIC
BILIRUBIN URINE: NEGATIVE
GLUCOSE, UA: NEGATIVE mg/dL
Hgb urine dipstick: NEGATIVE
KETONES UR: NEGATIVE mg/dL
Leukocytes, UA: NEGATIVE
NITRITE: NEGATIVE
PH: 6 (ref 5.0–8.0)
Protein, ur: NEGATIVE mg/dL
Specific Gravity, Urine: 1.017 (ref 1.005–1.030)

## 2017-04-12 LAB — COMPREHENSIVE METABOLIC PANEL
ALK PHOS: 66 U/L (ref 38–126)
ALT: 23 U/L (ref 17–63)
AST: 30 U/L (ref 15–41)
Albumin: 4.3 g/dL (ref 3.5–5.0)
Anion gap: 12 (ref 5–15)
BILIRUBIN TOTAL: 0.9 mg/dL (ref 0.3–1.2)
BUN: 15 mg/dL (ref 6–20)
CALCIUM: 9.3 mg/dL (ref 8.9–10.3)
CO2: 24 mmol/L (ref 22–32)
CREATININE: 1.08 mg/dL (ref 0.61–1.24)
Chloride: 103 mmol/L (ref 101–111)
GFR calc non Af Amer: >60 mL/min (ref >60)
Glucose, Bld: 83 mg/dL (ref 65–99)
Potassium: 3.8 mmol/L (ref 3.5–5.1)
SODIUM: 139 mmol/L (ref 135–145)
Total Protein: 8.3 g/dL — ABNORMAL HIGH (ref 6.5–8.1)

## 2017-04-12 LAB — CBC WITH DIFFERENTIAL/PLATELET
Basophils Absolute: 0 10*3/uL (ref 0.0–0.1)
Basophils Relative: 1 %
EOS ABS: 0.2 10*3/uL (ref 0.0–0.7)
Eosinophils Relative: 3 %
HEMATOCRIT: 40.3 % (ref 39.0–52.0)
HEMOGLOBIN: 13.6 g/dL (ref 13.0–17.0)
LYMPHS ABS: 3.3 10*3/uL (ref 0.7–4.0)
LYMPHS PCT: 50 %
MCH: 32.3 pg (ref 26.0–34.0)
MCHC: 33.7 g/dL (ref 30.0–36.0)
MCV: 95.7 fL (ref 78.0–100.0)
Monocytes Absolute: 0.4 10*3/uL (ref 0.1–1.0)
Monocytes Relative: 6 %
NEUTROS ABS: 2.5 10*3/uL (ref 1.7–7.7)
NEUTROS PCT: 40 %
Platelets: 295 10*3/uL (ref 150–400)
RBC: 4.21 MIL/uL — AB (ref 4.22–5.81)
RDW: 12 % (ref 11.5–15.5)
WBC: 6.4 10*3/uL (ref 4.0–10.5)

## 2017-04-12 LAB — LIPASE, BLOOD: Lipase: 24 U/L (ref 11–51)

## 2017-04-12 MED ORDER — AMOXICILLIN-POT CLAVULANATE 875-125 MG PO TABS: 1.0000 | ORAL_TABLET | Freq: Two times a day (BID) | ORAL | 0 refills | Status: DC

## 2017-04-12 MED ORDER — AMOXICILLIN-POT CLAVULANATE 875-125 MG PO TABS: 1.0000 | ORAL_TABLET | Freq: Two times a day (BID) | ORAL | 0 refills | Status: AC

## 2017-04-12 MED ORDER — SODIUM CHLORIDE 0.9 % IV BOLUS (SEPSIS)
1000.0000 mL | Freq: Once | INTRAVENOUS | Status: AC
  Administered 2017-04-12: 1000 mL via INTRAVENOUS

## 2017-04-12 MED ORDER — IOPAMIDOL (ISOVUE-300) INJECTION 61%
INTRAVENOUS | Status: AC
  Administered 2017-04-12: 100 mL
  Filled 2017-04-12: qty 100

## 2017-04-12 NOTE — ED Triage Notes (Signed)
Pt reports that he was sent in by PCP  from Urgent care for abnormal CT scan showing signs of  Early appendicitis. Pt denies pain at this time and states with palpitation was tender some cramping. Pt denies n/v/d.

## 2017-04-12 NOTE — Discharge Instructions (Signed)
As we discussed, your CT scan showed a small stone in your appendix but there were no clinical signs of appendicitis today.  This is improved from your CAT scan several days ago.  It is unclear whether you had an acute appendicitis that is resolved, or if the CT findings were incidental.  Given your transient fever and symptoms last week, I think it is safe to give you a brief course of antibiotics.  Take these with food.  The most important thing is that if you develop any recurrent abdominal pain, regardless of location, as well as any nausea, vomiting, diarrhea, or other concerning symptoms, particularly fever, you return to the ER immediately.  I would recommend seeing your primary care doctor in the next 1-2 days for a repeat exam.

## 2017-04-12 NOTE — ED Notes (Signed)
Triage completed by C. Mariateresa Batra, Error in charting under another user.

## 2017-04-12 NOTE — ED Provider Notes (Signed)
[facility] Provider Note   CSN: [id number] Arrival date & time: 04/12/17  1601     History   Chief Complaint Chief Complaint  Patient presents with  . abnormal CT/ Early Appendicitis    HPI Zachary Harrington is a 42 y.o. male.  HPI   42 yo M with no significant PMHx here with transient fever, abdominal pain.  Patient reports that approximately 1 week ago, he developed fevers, diffuse abdominal pain, that localized to his right lower quadrant.  He was seen at urgent care and sent for a CT scan.  Obtain a CT on 2/8.  He states that over the weekend, his symptoms improved and he was able to actually play basketball in the weekend.  He has not had any further fevers.  He has had mildly decreased appetite but denies any other symptoms.  No ongoing abdominal pain.  No change in his bowel habits.  He was called by the office on Monday and notify that his CT scan showed possible early appendicitis.  He subsequent presents for evaluation.  Denies any worsening of his pain.  No fevers or chills.  No cough or shortness of breath.  No chest pain.  Denies any history of surgeries.  No other medical complaints.  Past Medical History:  Diagnosis Date  . Allergy     Patient Active Problem List   Diagnosis Date Noted  . Allergy history unknown 07/20/2011    History reviewed. No pertinent surgical history.     Home Medications    Prior to Admission medications   Medication Sig Start Date End Date Taking? Authorizing Provider  HYDROcodone-ibuprofen (VICOPROFEN) 7.5-200 MG tablet Take 1 tablet by mouth every 6 (six) hours as needed for pain. 04/06/17  Yes [provider]  ibuprofen (ADVIL,MOTRIN) 200 MG tablet Take 400 mg by mouth daily as needed for moderate pain.   Yes [provider]  Multiple Vitamins-Minerals (MEGA MULTI MEN PO) Take 1 tablet by mouth daily.   Yes [provider]  ALPRAZolam Prudy Feeler) 1 MG tablet Take 1 tablet  (1 mg total) by mouth at bedtime as needed for sleep. Patient not taking: Reported on 04/12/2017 07/27/12   Copland, Gwenlyn Found, MD  amoxicillin (AMOXIL) 500 MG capsule Take 2 capsules (1,000 mg total) by mouth 2 (two) times daily. Patient not taking: Reported on 04/12/2017 09/15/16   Hayden Rasmussen, NP  amoxicillin-clavulanate (AUGMENTIN) 875-125 MG tablet Take 1 tablet by mouth every 12 (twelve) hours for 7 days. 04/12/17 04/19/17  Shaune Pollack, MD  azelastine (ASTELIN) 0.1 % nasal spray Place 1 spray into both nostrils 2 (two) times daily. PATIENT NEEDS OFFICE VISIT FOR ADDITIONAL REFILLS Patient not taking: Reported on 04/12/2017 09/02/14   Porfirio Oar, PA-C  cetirizine (ZYRTEC) 10 MG tablet Take 1 tablet (10 mg total) by mouth daily. Patient not taking: Reported on 04/12/2017 04/23/12   Jonita Albee, MD  cyclobenzaprine (FLEXERIL) 5 MG tablet Take 1 tablet (5 mg total) by mouth at bedtime as needed. Patient not taking: Reported on 04/12/2017 06/17/13   Le, Thao P, DO  HYDROcodone-acetaminophen (NORCO) 5-325 MG per tablet Take 1-2 tablets by mouth every 8 (eight) hours as needed. Patient not taking: Reported on 04/12/2017 06/17/13   Le, Thao P, DO  loratadine (CLARITIN) 10 MG tablet Take 1 tablet (10 mg total) by mouth daily. Patient not taking: Reported on 04/12/2017 04/23/12   Jonita Albee, MD    Family History Family History  Problem  Relation Age of Onset  . COPD Mother     Social History Social History   Tobacco Use  . Smoking status: Never Smoker  . Smokeless tobacco: Never Used  Substance Use Topics  . Alcohol use: Yes  . Drug use: No     Allergies   Patient has no known allergies.   Review of Systems Review of Systems  Constitutional: Negative for chills, fatigue and fever.  HENT: Negative for congestion and rhinorrhea.   Eyes: Negative for visual disturbance.  Respiratory: Negative for cough, shortness of breath and wheezing.   Cardiovascular: Negative for chest pain  and leg swelling.  Gastrointestinal: Positive for abdominal pain (transient, resolved). Negative for diarrhea, nausea and vomiting.  Genitourinary: Negative for dysuria and flank pain.  Musculoskeletal: Negative for neck pain and neck stiffness.  Skin: Negative for rash and wound.  Allergic/Immunologic: Negative for immunocompromised state.  Neurological: Negative for syncope, weakness and headaches.  All other systems reviewed and are negative.    Physical Exam Updated Vital Signs BP 127/84   Pulse 99   Temp 98.6 F (37 C) (Oral)   Resp 16   Ht 6' (1.829 m)   Wt 98 kg (216 lb)   SpO2 98%   BMI 29.29 kg/m   Physical Exam  Constitutional: He is oriented to person, place, and time. He appears well-developed and well-nourished. No distress.  HENT:  Head: Normocephalic and atraumatic.  Mouth/Throat: Oropharynx is clear and moist.  Eyes: Conjunctivae are normal.  Neck: Neck supple.  Cardiovascular: Normal rate, regular rhythm and normal heart sounds. Exam reveals no friction rub.  No murmur heard. Pulmonary/Chest: Effort normal and breath sounds normal. No respiratory distress. He has no wheezes. He has no rales.  Abdominal: Soft. Bowel sounds are normal. He exhibits no distension. There is no tenderness. There is no rebound and no guarding.  Musculoskeletal: He exhibits no edema.  Neurological: He is alert and oriented to person, place, and time. He exhibits normal muscle tone.  Skin: Skin is warm. Capillary refill takes less than 2 seconds.  Psychiatric: He has a normal mood and affect.  Nursing note and vitals reviewed.    ED Treatments / Results  Labs (all labs ordered are listed, but only abnormal results are displayed) Labs Reviewed  CBC WITH DIFFERENTIAL/PLATELET - Abnormal; Notable for the following components:      Result Value   RBC 4.21 (*)    All other components within normal limits  COMPREHENSIVE METABOLIC PANEL - Abnormal; Notable for the following  components:   Total Protein 8.3 (*)    All other components within normal limits  URINALYSIS, ROUTINE W REFLEX MICROSCOPIC  LIPASE, BLOOD    EKG  EKG Interpretation None       Radiology Ct Abdomen Pelvis W Contrast  Result Date: 04/12/2017 CLINICAL DATA:  Abdominal distention, nausea, vomiting EXAM: CT ABDOMEN AND PELVIS WITH CONTRAST TECHNIQUE: Multidetector CT imaging of the abdomen and pelvis was performed using the standard protocol following bolus administration of intravenous contrast. CONTRAST:  100mL ISOVUE-300 IOPAMIDOL (ISOVUE-300) INJECTION 61% COMPARISON:  None. FINDINGS: Lower chest: Lung bases are clear. No effusions. Heart is normal size. Hepatobiliary: No focal hepatic abnormality. Gallbladder unremarkable. Pancreas: No focal abnormality or ductal dilatation. Spleen: No focal abnormality.  Normal size. Adrenals/Urinary Tract: No adrenal abnormality. No focal renal abnormality. No stones or hydronephrosis. Urinary bladder is unremarkable. Stomach/Bowel: The appendix is mildly prominent, measuring up to 9 mm maximally on coronal image 38. There is an  appendicolith within the distal appendix. No convincing periappendiceal inflammation. Stomach, large and small bowel grossly unremarkable. Vascular/Lymphatic: No evidence of aneurysm or adenopathy. Reproductive: No visible focal abnormality. Other: No free fluid or free air. Musculoskeletal: No acute bony abnormality. IMPRESSION: Appendix slightly dilated, measuring up to 9 mm. There is an appendicolith in the distal appendix. No convincing periappendiceal inflammation. Recommend clinical correlation for possible early appendicitis. Electronically Signed   By: Charlett Nose M.D.   On: 04/12/2017 19:28    Procedures Procedures (including critical care time)  Medications Ordered in ED Medications  sodium chloride 0.9 % bolus 1,000 mL (0 mLs Intravenous Stopped 04/12/17 1816)  iopamidol (ISOVUE-300) 61 % injection (100 mLs  Contrast  Given 04/12/17 1906)     Initial Impression / Assessment and Plan / ED Course  I have reviewed the triage vital signs and the nursing notes.  Pertinent labs & imaging results that were available during my care of the patient were reviewed by me and considered in my medical decision making (see chart for details).     42 year old male sent here for possible appendicitis identified on CT on Friday.  However, the patient now states his pain is completely resolved.  Is able to play basketball yesterday without difficulty.  He has had no further fevers.  On my exam, the patient has absolutely no abdominal tenderness.  Lab work shows normal white blood cell count.  CMP unremarkable.  Urinalysis without inflammation.  CT scan obtained due to early appendicitis identified on 2/8 and shows an appendicolith and possible mild dilatation of the appendix but no periappendiceal inflammation.  I discussed the case in detail with Dr. Luisa Hart.  He has reviewed the imaging.  CT findings today are improved from 2/8, which showed appendix at 11 mm and with periappendiceal inflammation. He does not feel that given normal white count, duration of symptoms, and stable vital signs with absence of any ongoing pain, he does not feel that this represents acute appendicitis and he recommends discharge home with close outpatient follow-up.  Given that the patient does have some baseline constipation will start him on stool softener.  I discussed strict return precautions including any recurrence of pain, fever, or other concerning appendiceal signs.  Final Clinical Impressions(s) / ED Diagnoses   Final diagnoses:  Abdominal pain, unspecified abdominal location  Abnormal CT scan    ED Discharge Orders        Ordered    amoxicillin-clavulanate (AUGMENTIN) 875-125 MG tablet  Every 12 hours     04/12/17 2008       Shaune Pollack, MD 04/12/17 2010

## 2017-04-16 DIAGNOSIS — G4733 Obstructive sleep apnea (adult) (pediatric): Secondary | ICD-10-CM | POA: Diagnosis not present

## 2017-05-14 DIAGNOSIS — G4733 Obstructive sleep apnea (adult) (pediatric): Secondary | ICD-10-CM | POA: Diagnosis not present

## 2017-06-14 DIAGNOSIS — G4733 Obstructive sleep apnea (adult) (pediatric): Secondary | ICD-10-CM | POA: Diagnosis not present

## 2017-06-15 DIAGNOSIS — M545 Low back pain: Secondary | ICD-10-CM | POA: Diagnosis not present

## 2017-08-30 ENCOUNTER — Other Ambulatory Visit (INDEPENDENT_AMBULATORY_CARE_PROVIDER_SITE_OTHER): Payer: Self-pay

## 2017-08-30 ENCOUNTER — Ambulatory Visit (INDEPENDENT_AMBULATORY_CARE_PROVIDER_SITE_OTHER): Payer: Self-pay

## 2017-08-30 ENCOUNTER — Encounter (INDEPENDENT_AMBULATORY_CARE_PROVIDER_SITE_OTHER): Payer: Self-pay | Admitting: Orthopaedic Surgery

## 2017-08-30 ENCOUNTER — Ambulatory Visit (INDEPENDENT_AMBULATORY_CARE_PROVIDER_SITE_OTHER): Payer: Federal, State, Local not specified - PPO | Admitting: Orthopaedic Surgery

## 2017-08-30 DIAGNOSIS — M25561 Pain in right knee: Secondary | ICD-10-CM

## 2017-08-30 DIAGNOSIS — M79604 Pain in right leg: Secondary | ICD-10-CM

## 2017-08-30 DIAGNOSIS — M25562 Pain in left knee: Secondary | ICD-10-CM | POA: Diagnosis not present

## 2017-08-30 DIAGNOSIS — M79605 Pain in left leg: Principal | ICD-10-CM

## 2017-08-30 DIAGNOSIS — G8929 Other chronic pain: Secondary | ICD-10-CM | POA: Diagnosis not present

## 2017-08-30 MED ORDER — MELOXICAM 7.5 MG PO TABS: 15.0000 mg | ORAL_TABLET | Freq: Every day | ORAL | 2 refills | Status: DC | PRN

## 2017-08-30 MED ORDER — DICLOFENAC SODIUM 1 % TD GEL: 2.0000 g | Freq: Four times a day (QID) | TRANSDERMAL | 5 refills | Status: DC

## 2017-08-30 NOTE — Progress Notes (Signed)
Office Visit Note   Patient: Zachary Harrington           Date of Birth: 1975-03-24           MRN: [id number] Visit Date: 08/30/2017              Requested by: No referring provider defined for this encounter. PCP: Patient, No Pcp Per   Assessment & Plan: Visit Diagnoses:  1. Chronic pain of both knees     Plan: Impression is bilateral insertional quadriceps tendinosis.  Her x-rays were reviewed with the patient.  He does have significant enthesophytosis.  I recommend meloxicam for short term and topical Voltaren gel.  Recommend physical therapy for stretching and strengthening.    Follow-Up Instructions: Return if symptoms worsen or fail to improve.   Orders:  Orders Placed This Encounter  Procedures  . XR KNEE 3 VIEW RIGHT  . XR KNEE 3 VIEW LEFT   Meds ordered this encounter  Medications  . meloxicam (MOBIC) 7.5 MG tablet    Sig: Take 2 tablets (15 mg total) by mouth daily as needed for pain.    Dispense:  30 tablet    Refill:  2  . diclofenac sodium (VOLTAREN) 1 % GEL    Sig: Apply 2 g topically 4 (four) times daily.    Dispense:  1 Tube    Refill:  5      Procedures: No procedures performed   Clinical Data: No additional findings.   Subjective: Chief Complaint  Patient presents with  . Left Knee - Pain  . Right Knee - Pain    Patient is a very pleasant and healthy 42 year old gentleman who comes in with chronic bilateral knee pain worse on the right.  His pain is superior to the patella.  He denies any swelling.  He states that the pain is worse with chronic and prolonged sitting with his knees bent.  He has chronic constant pain.  Denies any numbness and tingling.  Denies any mechanical symptoms.   Review of Systems  Constitutional: Negative.   All other systems reviewed and are negative.    Objective: Vital Signs: There were no vitals taken for this visit.  Physical Exam  Constitutional: He is oriented to person, place, and time. He appears  well-developed and well-nourished.  HENT:  Head: Normocephalic and atraumatic.  Eyes: Pupils are equal, round, and reactive to light.  Neck: Neck supple.  Pulmonary/Chest: Effort normal.  Abdominal: Soft.  Musculoskeletal: Normal range of motion.  Neurological: He is alert and oriented to person, place, and time.  Skin: Skin is warm.  Psychiatric: He has a normal mood and affect. His behavior is normal. Judgment and thought content normal.  Nursing note and vitals reviewed.   Ortho Exam Bilateral knee exam shows no joint effusion.  Collaterals and cruciates are stable.  Minimal patellofemoral crepitus.  Pain is mainly located in the superior patella region at the insertion of the quadriceps. Specialty Comments:  No specialty comments available.  Imaging: Xr Knee 3 View Left  Result Date: 08/30/2017 Significant superior and inferior enthesophytes the patella  Xr Knee 3 View Right  Result Date: 08/30/2017 Significant superior enthesophyte of right patella    PMFS History: Patient Active Problem List   Diagnosis Date Noted  . Allergy history unknown 07/20/2011   Past Medical History:  Diagnosis Date  . Allergy     Family History  Problem Relation Age of Onset  . COPD Mother  History reviewed. No pertinent surgical history. Social History   Occupational History  . Not on file  Tobacco Use  . Smoking status: Never Smoker  . Smokeless tobacco: Never Used  Substance and Sexual Activity  . Alcohol use: Yes  . Drug use: No  . Sexual activity: Yes    Birth control/protection: None

## 2017-10-26 DIAGNOSIS — M25561 Pain in right knee: Secondary | ICD-10-CM | POA: Diagnosis not present

## 2017-10-26 DIAGNOSIS — M62552 Muscle wasting and atrophy, not elsewhere classified, left thigh: Secondary | ICD-10-CM | POA: Diagnosis not present

## 2017-10-26 DIAGNOSIS — M25562 Pain in left knee: Secondary | ICD-10-CM | POA: Diagnosis not present

## 2017-10-26 DIAGNOSIS — R269 Unspecified abnormalities of gait and mobility: Secondary | ICD-10-CM | POA: Diagnosis not present

## 2017-11-09 DIAGNOSIS — M25562 Pain in left knee: Secondary | ICD-10-CM | POA: Diagnosis not present

## 2017-11-09 DIAGNOSIS — M62552 Muscle wasting and atrophy, not elsewhere classified, left thigh: Secondary | ICD-10-CM | POA: Diagnosis not present

## 2017-11-09 DIAGNOSIS — R269 Unspecified abnormalities of gait and mobility: Secondary | ICD-10-CM | POA: Diagnosis not present

## 2017-11-09 DIAGNOSIS — M25561 Pain in right knee: Secondary | ICD-10-CM | POA: Diagnosis not present

## 2017-11-23 DIAGNOSIS — M25562 Pain in left knee: Secondary | ICD-10-CM | POA: Diagnosis not present

## 2017-11-23 DIAGNOSIS — R269 Unspecified abnormalities of gait and mobility: Secondary | ICD-10-CM | POA: Diagnosis not present

## 2017-11-23 DIAGNOSIS — M62552 Muscle wasting and atrophy, not elsewhere classified, left thigh: Secondary | ICD-10-CM | POA: Diagnosis not present

## 2017-11-23 DIAGNOSIS — M25561 Pain in right knee: Secondary | ICD-10-CM | POA: Diagnosis not present

## 2018-01-18 ENCOUNTER — Ambulatory Visit (INDEPENDENT_AMBULATORY_CARE_PROVIDER_SITE_OTHER): Payer: Federal, State, Local not specified - PPO | Admitting: Family Medicine

## 2018-01-18 ENCOUNTER — Encounter (INDEPENDENT_AMBULATORY_CARE_PROVIDER_SITE_OTHER): Payer: Self-pay | Admitting: Family Medicine

## 2018-01-18 DIAGNOSIS — M25521 Pain in right elbow: Secondary | ICD-10-CM

## 2018-01-18 DIAGNOSIS — M25512 Pain in left shoulder: Secondary | ICD-10-CM | POA: Diagnosis not present

## 2018-01-18 MED ORDER — ETODOLAC 400 MG PO TABS: 400.0000 mg | ORAL_TABLET | Freq: Two times a day (BID) | ORAL | 3 refills | Status: DC | PRN

## 2018-01-18 NOTE — Progress Notes (Signed)
   Office Visit Note   Patient: Zachary Harrington           Date of Birth: 1975/12/18           MRN: [id number] Visit Date: 01/18/2018 Requested by: No referring provider defined for this encounter. PCP: Patient, No Pcp Per  Subjective: Chief Complaint  Patient presents with  . Left Shoulder - Pain    Pain in the shoulder with decreasing ROM x 3 weeks.  . Right Elbow - Pain    Pain x 6 months. No specific injury. H/o tennis elbow with his previous job.    HPI: He is a 42 year old right-hand-dominant male with right elbow and left shoulder pain.  Right elbow started bothering him about 6 months ago.  He woke up one day with pain and it has not quit hurting since then.  It bothers him intermittently, mainly when doing biceps curls and other lifting activities.  He cannot pinpoint the location of his pain but seems to hurt more on the anterior aspect.  Denies any numbness or tingling.  He has not taken any medications for this on a regular basis.  He tried meloxicam a couple times but it did not work.  Left shoulder has been hurting for a few months.  It hurts when he reaches laterally and behind.  Pain seems to be on top of his shoulder.  He is otherwise in good health.  He works for the [employer].                ROS: Otherwise noncontributory  Objective: Vital Signs: There were no vitals taken for this visit.  Physical Exam:  Left shoulder: Full active range of motion compared to the right, no adhesive capsulitis.  No tenderness at the [facility] joint or the long head biceps tendon.  No pain in the subacromial space.  Rotator cuff strength is 5/5 but he has pain with empty can test.  No pain with internal/external rotation against resistance. Right elbow: Full flexion and extension compared to the left, full pronation and supination.  No tenderness over the epicondyles or the biceps tendon.  He does have pain with supination against resistance.  Cannot completely reproduce his pain by  palpation.  No joint effusion.  Imaging: None today.  Assessment & Plan: 1.  Right elbow pain, suspect supinator muscle strain -[facility]  Lodine for inflammation.  X-rays and further imaging if symptoms persist.  2.  Left shoulder impingement -Treatment as above.   Follow-Up Instructions: No follow-ups on file.      Procedures: No procedures performed  No notes on file    PMFS History: Patient Active Problem List   Diagnosis Date Noted  . Allergy history unknown 07/20/2011   Past Medical History:  Diagnosis Date  . Allergy     Family History  Problem Relation Age of Onset  . COPD Mother     No past surgical history on file. Social History   Occupational History  . Not on file  Tobacco Use  . Smoking status: Never Smoker  . Smokeless tobacco: Never Used  Substance and Sexual Activity  . Alcohol use: Yes  . Drug use: No  . Sexual activity: Yes    Birth control/protection: None

## 2018-03-08 ENCOUNTER — Ambulatory Visit (INDEPENDENT_AMBULATORY_CARE_PROVIDER_SITE_OTHER): Payer: Federal, State, Local not specified - PPO | Admitting: Orthopaedic Surgery

## 2018-03-14 ENCOUNTER — Ambulatory Visit (INDEPENDENT_AMBULATORY_CARE_PROVIDER_SITE_OTHER): Payer: Federal, State, Local not specified - PPO | Admitting: Orthopaedic Surgery

## 2018-03-14 ENCOUNTER — Encounter (INDEPENDENT_AMBULATORY_CARE_PROVIDER_SITE_OTHER): Payer: Self-pay | Admitting: Orthopaedic Surgery

## 2018-03-14 ENCOUNTER — Ambulatory Visit (INDEPENDENT_AMBULATORY_CARE_PROVIDER_SITE_OTHER): Payer: Self-pay

## 2018-03-14 DIAGNOSIS — M25512 Pain in left shoulder: Secondary | ICD-10-CM | POA: Diagnosis not present

## 2018-03-14 MED ORDER — METHYLPREDNISOLONE ACETATE 40 MG/ML IJ SUSP
40.0000 mg | INTRAMUSCULAR | Status: AC | PRN
  Administered 2018-03-14: 40 mg via INTRA_ARTICULAR

## 2018-03-14 MED ORDER — LIDOCAINE HCL 2 % IJ SOLN
2.0000 mL | INTRAMUSCULAR | Status: AC | PRN
  Administered 2018-03-14: 2 mL

## 2018-03-14 MED ORDER — BUPIVACAINE HCL 0.25 % IJ SOLN
2.0000 mL | INTRAMUSCULAR | Status: AC | PRN
  Administered 2018-03-14: 2 mL via INTRA_ARTICULAR

## 2018-03-14 NOTE — Progress Notes (Signed)
Office Visit Note   Patient: Zachary Harrington           Date of Birth: 01-Dec-1975           MRN: [id number] Visit Date: 03/14/2018              Requested by: No referring provider defined for this encounter. PCP: Patient, No Pcp Per   Assessment & Plan: Visit Diagnoses:  1. Left shoulder pain, unspecified chronicity     Plan: Impression is left shoulder subacromial bursitis rotator cuff tendinitis.  Today, we will inject the left subacromial space with cortisone.  I will also provide the patient with a Jobe exercise program.  He will call us if he is not any better in the next several weeks.  Otherwise, follow-up with us as needed.  Follow-Up Instructions: Return if symptoms worsen or fail to improve.   Orders:  Orders Placed This Encounter  Procedures  . Large Joint Inj: L subacromial bursa  . XR Shoulder Left   No orders of the defined types were placed in this encounter.     Procedures: Large Joint Inj: L subacromial bursa on 03/14/2018 4:00 PM Indications: pain Details: 22 G needle Medications: 2 mL bupivacaine 0.25 %; 2 mL lidocaine 2 %; 40 mg methylPREDNISolone acetate 40 MG/ML Outcome: tolerated well, no immediate complications Patient was prepped and draped in the usual sterile fashion.       Clinical Data: No additional findings.   Subjective: Chief Complaint  Patient presents with  . 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  This began about 3 to 4 months ago while goofing around.  He abducted his left arm and quickly withdrew causing moderate pain to the left shoulder.  The pain he has is deep within.  Pain is worse with abduction and internal rotation.  He does have occasional pain and soreness after he has been sleeping on the left side.  He takes ibuprofen with mild relief of symptoms.  No radicular symptoms noted.  He was seen  by Dr. Prince Romehilts last month where it was recommended that he attend formal physical therapy.  Due to his busy season at work he was unable to do so.  Review of Systems as detailed HPI.  All others reviewed and are negative.   Objective: Vital Signs: There were no vitals taken for this visit.  Physical Exam well-developed well-nourished gentleman in no acute distress.  Alert and oriented times.    Ortho Exam examination of the left shoulder shows full active range of motion all planes.  He can internally rotate to L5.  Positive empty can.  No tenderness over the [facility] joint.  He is neurovascularly intact distally.  Specialty Comments:  No specialty comments available.  Imaging: No new imaging   PMFS History: Patient Active Problem List   Diagnosis Date Noted  . Left shoulder pain 03/14/2018  . Allergy history unknown 07/20/2011   Past Medical History:  Diagnosis Date  . Allergy     Family History  Problem Relation Age of Onset  . COPD Mother     History reviewed. No pertinent surgical history. Social History   Occupational History  . Not on file  Tobacco Use  . Smoking status: Never Smoker  . Smokeless tobacco: Never Used  Substance and Sexual Activity  . Alcohol use: Yes  . Drug use: No  .  Sexual activity: Yes    Birth control/protection: None

## 2018-10-23 DIAGNOSIS — L648 Other androgenic alopecia: Secondary | ICD-10-CM | POA: Diagnosis not present

## 2019-03-03 ENCOUNTER — Emergency Department (HOSPITAL_COMMUNITY): Payer: Federal, State, Local not specified - PPO

## 2019-03-03 ENCOUNTER — Emergency Department (HOSPITAL_COMMUNITY): Payer: Federal, State, Local not specified - PPO | Admitting: Anesthesiology

## 2019-03-03 ENCOUNTER — Encounter (HOSPITAL_COMMUNITY): Payer: Self-pay | Admitting: Emergency Medicine

## 2019-03-03 ENCOUNTER — Ambulatory Visit (HOSPITAL_COMMUNITY)
Admission: EM | Admit: 2019-03-03 | Discharge: 2019-03-03 | Disposition: A | Discharge status: Home / Self Care | Payer: Federal, State, Local not specified - PPO | Attending: Emergency Medicine | Admitting: Emergency Medicine

## 2019-03-03 ENCOUNTER — Encounter (HOSPITAL_COMMUNITY)
Admission: EM | Disposition: A | Discharge status: Home / Self Care | Payer: Self-pay | Source: Home / Self Care | Attending: Emergency Medicine

## 2019-03-03 ENCOUNTER — Other Ambulatory Visit: Payer: Self-pay

## 2019-03-03 DIAGNOSIS — R109 Unspecified abdominal pain: Secondary | ICD-10-CM | POA: Diagnosis not present

## 2019-03-03 DIAGNOSIS — K353 Acute appendicitis with localized peritonitis, without perforation or gangrene: Secondary | ICD-10-CM | POA: Diagnosis not present

## 2019-03-03 DIAGNOSIS — Z20822 Contact with and (suspected) exposure to covid-19: Secondary | ICD-10-CM | POA: Insufficient documentation

## 2019-03-03 DIAGNOSIS — Z03818 Encounter for observation for suspected exposure to other biological agents ruled out: Secondary | ICD-10-CM | POA: Diagnosis not present

## 2019-03-03 DIAGNOSIS — K3580 Unspecified acute appendicitis: Secondary | ICD-10-CM | POA: Diagnosis not present

## 2019-03-03 HISTORY — PX: LAPAROSCOPIC APPENDECTOMY: SHX408

## 2019-03-03 LAB — URINALYSIS, ROUTINE W REFLEX MICROSCOPIC
Bilirubin Urine: NEGATIVE
Glucose, UA: NEGATIVE mg/dL
Hgb urine dipstick: NEGATIVE
Ketones, ur: NEGATIVE mg/dL
Leukocytes,Ua: NEGATIVE
Nitrite: NEGATIVE
Protein, ur: NEGATIVE mg/dL
Specific Gravity, Urine: 1.025 (ref 1.005–1.030)
pH: 6 (ref 5.0–8.0)

## 2019-03-03 LAB — COMPREHENSIVE METABOLIC PANEL
ALT: 34 U/L (ref 0–44)
AST: 30 U/L (ref 15–41)
Albumin: 4.2 g/dL (ref 3.5–5.0)
Alkaline Phosphatase: 55 U/L (ref 38–126)
Anion gap: 11 (ref 5–15)
BUN: 15 mg/dL (ref 6–20)
CO2: 23 mmol/L (ref 22–32)
Calcium: 9.3 mg/dL (ref 8.9–10.3)
Chloride: 102 mmol/L (ref 98–111)
Creatinine, Ser: 1.24 mg/dL (ref 0.61–1.24)
GFR calc Af Amer: >60 mL/min (ref >60)
GFR calc non Af Amer: >60 mL/min (ref >60)
Glucose, Bld: 138 mg/dL — ABNORMAL HIGH (ref 70–99)
Potassium: 3.8 mmol/L (ref 3.5–5.1)
Sodium: 136 mmol/L (ref 135–145)
Total Bilirubin: 2.2 mg/dL — ABNORMAL HIGH (ref 0.3–1.2)
Total Protein: 7.3 g/dL (ref 6.5–8.1)

## 2019-03-03 LAB — CBC
HCT: 40.8 % (ref 39.0–52.0)
Hemoglobin: 13.8 g/dL (ref 13.0–17.0)
MCH: 33.1 pg (ref 26.0–34.0)
MCHC: 33.8 g/dL (ref 30.0–36.0)
MCV: 97.8 fL (ref 80.0–100.0)
Platelets: 289 10*3/uL (ref 150–400)
RBC: 4.17 MIL/uL — ABNORMAL LOW (ref 4.22–5.81)
RDW: 11.8 % (ref 11.5–15.5)
WBC: 13.7 10*3/uL — ABNORMAL HIGH (ref 4.0–10.5)
nRBC: 0 % (ref 0.0–0.2)

## 2019-03-03 LAB — RESPIRATORY PANEL BY RT PCR (FLU A&B, COVID)
Influenza A by PCR: NEGATIVE
Influenza B by PCR: NEGATIVE
SARS Coronavirus 2 by RT PCR: NEGATIVE

## 2019-03-03 LAB — LIPASE, BLOOD: Lipase: 20 U/L (ref 11–51)

## 2019-03-03 SURGERY — APPENDECTOMY, LAPAROSCOPIC
Anesthesia: General | Site: Abdomen

## 2019-03-03 MED ORDER — SUGAMMADEX SODIUM 500 MG/5ML IV SOLN
INTRAVENOUS | Status: AC
  Filled 2019-03-03: qty 5

## 2019-03-03 MED ORDER — MIDAZOLAM HCL 5 MG/5ML IJ SOLN
INTRAMUSCULAR | Status: DC | PRN
  Administered 2019-03-03: 2 mg via INTRAVENOUS

## 2019-03-03 MED ORDER — FENTANYL CITRATE (PF) 100 MCG/2ML IJ SOLN: 25.0000 ug | INTRAMUSCULAR | Status: DC | PRN

## 2019-03-03 MED ORDER — ACETAMINOPHEN 500 MG PO TABS
1000.0000 mg | ORAL_TABLET | ORAL | Status: AC
  Administered 2019-03-03: 1000 mg via ORAL

## 2019-03-03 MED ORDER — KETOROLAC TROMETHAMINE 15 MG/ML IJ SOLN
INTRAMUSCULAR | Status: AC
  Administered 2019-03-03: 15 mg
  Filled 2019-03-03: qty 1

## 2019-03-03 MED ORDER — SODIUM CHLORIDE 0.9 % IV SOLN: 250.0000 mL | INTRAVENOUS | Status: DC | PRN

## 2019-03-03 MED ORDER — SODIUM CHLORIDE 0.9% FLUSH: 3.0000 mL | Freq: Two times a day (BID) | INTRAVENOUS | Status: DC

## 2019-03-03 MED ORDER — SCOPOLAMINE 1 MG/3DAYS TD PT72: 1.0000 | MEDICATED_PATCH | Freq: Once | TRANSDERMAL | Status: DC

## 2019-03-03 MED ORDER — SODIUM CHLORIDE 0.9 % IV SOLN
2.0000 g | Freq: Once | INTRAVENOUS | Status: AC
  Administered 2019-03-03: 2 g via INTRAVENOUS
  Filled 2019-03-03: qty 20

## 2019-03-03 MED ORDER — GLYCOPYRROLATE PF 0.2 MG/ML IJ SOSY
PREFILLED_SYRINGE | INTRAMUSCULAR | Status: AC
  Filled 2019-03-03: qty 1

## 2019-03-03 MED ORDER — IOHEXOL 300 MG/ML  SOLN
100.0000 mL | Freq: Once | INTRAMUSCULAR | Status: AC | PRN
  Administered 2019-03-03: 100 mL via INTRAVENOUS

## 2019-03-03 MED ORDER — ONDANSETRON HCL 4 MG/2ML IJ SOLN
INTRAMUSCULAR | Status: AC
  Filled 2019-03-03: qty 2

## 2019-03-03 MED ORDER — PROPOFOL 10 MG/ML IV BOLUS
INTRAVENOUS | Status: AC
  Filled 2019-03-03: qty 40

## 2019-03-03 MED ORDER — ACETAMINOPHEN 650 MG RE SUPP
650.0000 mg | RECTAL | Status: DC | PRN
  Filled 2019-03-03: qty 1

## 2019-03-03 MED ORDER — MORPHINE SULFATE (PF) 4 MG/ML IV SOLN: 1.0000 mg | INTRAVENOUS | Status: DC | PRN

## 2019-03-03 MED ORDER — 0.9 % SODIUM CHLORIDE (POUR BTL) OPTIME
TOPICAL | Status: DC | PRN
  Administered 2019-03-03: 1000 mL

## 2019-03-03 MED ORDER — LIDOCAINE 2% (20 MG/ML) 5 ML SYRINGE
INTRAMUSCULAR | Status: AC
  Filled 2019-03-03: qty 5

## 2019-03-03 MED ORDER — BUPIVACAINE HCL 0.25 % IJ SOLN
INTRAMUSCULAR | Status: DC | PRN
  Administered 2019-03-03: 9 mL

## 2019-03-03 MED ORDER — KETOROLAC TROMETHAMINE 15 MG/ML IJ SOLN
15.0000 mg | Freq: Once | INTRAMUSCULAR | Status: AC
  Administered 2019-03-03: 15 mg via INTRAVENOUS
  Filled 2019-03-03: qty 1

## 2019-03-03 MED ORDER — KETOROLAC TROMETHAMINE 30 MG/ML IJ SOLN: 15.0000 mg | INTRAMUSCULAR | Status: DC

## 2019-03-03 MED ORDER — CELECOXIB 200 MG PO CAPS: 400.0000 mg | ORAL_CAPSULE | Freq: Once | ORAL | Status: DC

## 2019-03-03 MED ORDER — OXYCODONE HCL 5 MG PO TABS
ORAL_TABLET | ORAL | Status: AC
  Filled 2019-03-03: qty 1

## 2019-03-03 MED ORDER — PROPOFOL 10 MG/ML IV BOLUS
INTRAVENOUS | Status: DC | PRN
  Administered 2019-03-03: 200 mg via INTRAVENOUS
  Administered 2019-03-03: 30 mg via INTRAVENOUS

## 2019-03-03 MED ORDER — CEFAZOLIN SODIUM-DEXTROSE 2-4 GM/100ML-% IV SOLN
INTRAVENOUS | Status: AC
  Filled 2019-03-03: qty 100

## 2019-03-03 MED ORDER — CELECOXIB 200 MG PO CAPS
ORAL_CAPSULE | ORAL | Status: AC
  Filled 2019-03-03: qty 2

## 2019-03-03 MED ORDER — GLYCOPYRROLATE PF 0.2 MG/ML IJ SOSY
PREFILLED_SYRINGE | INTRAMUSCULAR | Status: DC | PRN
  Administered 2019-03-03: .1 mg via INTRAVENOUS

## 2019-03-03 MED ORDER — LIDOCAINE 2% (20 MG/ML) 5 ML SYRINGE
INTRAMUSCULAR | Status: DC | PRN
  Administered 2019-03-03: 75 mg via INTRAVENOUS
  Administered 2019-03-03: 25 mg via INTRAVENOUS

## 2019-03-03 MED ORDER — FENTANYL CITRATE (PF) 250 MCG/5ML IJ SOLN
INTRAMUSCULAR | Status: AC
  Filled 2019-03-03: qty 5

## 2019-03-03 MED ORDER — SUCCINYLCHOLINE CHLORIDE 200 MG/10ML IV SOSY
PREFILLED_SYRINGE | INTRAVENOUS | Status: AC
  Filled 2019-03-03: qty 10

## 2019-03-03 MED ORDER — ONDANSETRON HCL 4 MG/2ML IJ SOLN
INTRAMUSCULAR | Status: DC | PRN
  Administered 2019-03-03: 4 mg via INTRAVENOUS

## 2019-03-03 MED ORDER — BUPIVACAINE HCL (PF) 0.25 % IJ SOLN
INTRAMUSCULAR | Status: AC
  Filled 2019-03-03: qty 30

## 2019-03-03 MED ORDER — SCOPOLAMINE 1 MG/3DAYS TD PT72
MEDICATED_PATCH | TRANSDERMAL | Status: AC
  Filled 2019-03-03: qty 1

## 2019-03-03 MED ORDER — LACTATED RINGERS IV SOLN: INTRAVENOUS | Status: DC | PRN

## 2019-03-03 MED ORDER — MIDAZOLAM HCL 2 MG/2ML IJ SOLN
INTRAMUSCULAR | Status: AC
  Filled 2019-03-03: qty 2

## 2019-03-03 MED ORDER — FENTANYL CITRATE (PF) 100 MCG/2ML IJ SOLN
INTRAMUSCULAR | Status: DC | PRN
  Administered 2019-03-03: 100 ug via INTRAVENOUS
  Administered 2019-03-03: 50 ug via INTRAVENOUS
  Administered 2019-03-03: 100 ug via INTRAVENOUS

## 2019-03-03 MED ORDER — OXYCODONE HCL 5 MG PO TABS
5.0000 mg | ORAL_TABLET | ORAL | Status: DC | PRN
  Administered 2019-03-03: 5 mg via ORAL

## 2019-03-03 MED ORDER — PHENYLEPHRINE HCL (PRESSORS) 10 MG/ML IV SOLN
INTRAVENOUS | Status: DC | PRN
  Administered 2019-03-03: 80 ug via INTRAVENOUS

## 2019-03-03 MED ORDER — ROCURONIUM BROMIDE 10 MG/ML (PF) SYRINGE
PREFILLED_SYRINGE | INTRAVENOUS | Status: AC
  Filled 2019-03-03: qty 10

## 2019-03-03 MED ORDER — DEXAMETHASONE SODIUM PHOSPHATE 10 MG/ML IJ SOLN
INTRAMUSCULAR | Status: AC
  Filled 2019-03-03: qty 1

## 2019-03-03 MED ORDER — ACETAMINOPHEN 500 MG PO TABS
ORAL_TABLET | ORAL | Status: AC
  Filled 2019-03-03: qty 2

## 2019-03-03 MED ORDER — SUGAMMADEX SODIUM 500 MG/5ML IV SOLN
INTRAVENOUS | Status: DC | PRN
  Administered 2019-03-03: 300 mg via INTRAVENOUS

## 2019-03-03 MED ORDER — SODIUM CHLORIDE 0.9% FLUSH: 3.0000 mL | INTRAVENOUS | Status: DC | PRN

## 2019-03-03 MED ORDER — OXYCODONE HCL 5 MG PO TABS: 5.0000 mg | ORAL_TABLET | Freq: Four times a day (QID) | ORAL | 0 refills | Status: DC | PRN

## 2019-03-03 MED ORDER — PROMETHAZINE HCL 25 MG/ML IJ SOLN: 6.2500 mg | INTRAMUSCULAR | Status: DC | PRN

## 2019-03-03 MED ORDER — METRONIDAZOLE IN NACL 5-0.79 MG/ML-% IV SOLN
500.0000 mg | Freq: Once | INTRAVENOUS | Status: AC
  Administered 2019-03-03: 500 mg via INTRAVENOUS
  Filled 2019-03-03: qty 100

## 2019-03-03 MED ORDER — CEFAZOLIN SODIUM-DEXTROSE 2-3 GM-%(50ML) IV SOLR
INTRAVENOUS | Status: DC | PRN
  Administered 2019-03-03: 1 g via INTRAVENOUS

## 2019-03-03 MED ORDER — SUCCINYLCHOLINE CHLORIDE 200 MG/10ML IV SOSY
PREFILLED_SYRINGE | INTRAVENOUS | Status: DC | PRN
  Administered 2019-03-03: 140 mg via INTRAVENOUS

## 2019-03-03 MED ORDER — LACTATED RINGERS IV SOLN
INTRAVENOUS | Status: DC | PRN
  Administered 2019-03-03: 1000 mL

## 2019-03-03 MED ORDER — SODIUM CHLORIDE 0.9% FLUSH
3.0000 mL | Freq: Once | INTRAVENOUS | Status: AC
  Administered 2019-03-03: 3 mL via INTRAVENOUS

## 2019-03-03 MED ORDER — DEXAMETHASONE SODIUM PHOSPHATE 10 MG/ML IJ SOLN
INTRAMUSCULAR | Status: DC | PRN
  Administered 2019-03-03: 10 mg via INTRAVENOUS

## 2019-03-03 MED ORDER — PHENYLEPHRINE 40 MCG/ML (10ML) SYRINGE FOR IV PUSH (FOR BLOOD PRESSURE SUPPORT)
PREFILLED_SYRINGE | INTRAVENOUS | Status: AC
  Filled 2019-03-03: qty 10

## 2019-03-03 MED ORDER — SODIUM CHLORIDE (PF) 0.9 % IJ SOLN
INTRAMUSCULAR | Status: AC
  Filled 2019-03-03: qty 50

## 2019-03-03 MED ORDER — ACETAMINOPHEN 325 MG PO TABS: 650.0000 mg | ORAL_TABLET | ORAL | Status: DC | PRN

## 2019-03-03 MED ORDER — ROCURONIUM BROMIDE 10 MG/ML (PF) SYRINGE
PREFILLED_SYRINGE | INTRAVENOUS | Status: DC | PRN
  Administered 2019-03-03: 45 mg via INTRAVENOUS

## 2019-03-03 SURGICAL SUPPLY — 51 items
ADH SKN CLS APL DERMABOND .7 (GAUZE/BANDAGES/DRESSINGS) ×1
APL PRP STRL LF DISP 70% ISPRP (MISCELLANEOUS) ×1
BAG SPEC RTRVL 10 TROC 200 (ENDOMECHANICALS) ×1
CABLE HIGH FREQUENCY MONO STRZ (ELECTRODE) ×2 IMPLANT
CHLORAPREP W/TINT 26 (MISCELLANEOUS) ×2 IMPLANT
CLSR STERI-STRIP ANTIMIC 1/2X4 (GAUZE/BANDAGES/DRESSINGS) ×1 IMPLANT
COVER SURGICAL LIGHT HANDLE (MISCELLANEOUS) ×2 IMPLANT
COVER WAND RF STERILE (DRAPES) IMPLANT
CUTTER FLEX LINEAR 45M (STAPLE) ×2 IMPLANT
DECANTER SPIKE VIAL GLASS SM (MISCELLANEOUS) ×2 IMPLANT
DERMABOND ADVANCED (GAUZE/BANDAGES/DRESSINGS) ×1
DERMABOND ADVANCED .7 DNX12 (GAUZE/BANDAGES/DRESSINGS) ×1 IMPLANT
DEVICE TROCAR PUNCTURE CLOSURE (ENDOMECHANICALS) ×2 IMPLANT
DRAPE LAPAROSCOPIC ABDOMINAL (DRAPES) ×2 IMPLANT
ELECT REM PT RETURN 15FT ADLT (MISCELLANEOUS) ×2 IMPLANT
GLOVE BIO SURGEON STRL SZ7 (GLOVE) ×4 IMPLANT
GLOVE BIOGEL PI IND STRL 7.0 (GLOVE) ×1 IMPLANT
GLOVE BIOGEL PI IND STRL 7.5 (GLOVE) ×2 IMPLANT
GLOVE BIOGEL PI INDICATOR 7.0 (GLOVE) ×1
GLOVE BIOGEL PI INDICATOR 7.5 (GLOVE) ×2
GOWN STRL REUS W/ TWL XL LVL3 (GOWN DISPOSABLE) ×1 IMPLANT
GOWN STRL REUS W/TWL LRG LVL3 (GOWN DISPOSABLE) ×4 IMPLANT
GOWN STRL REUS W/TWL XL LVL3 (GOWN DISPOSABLE) ×4 IMPLANT
GRASPER SUT TROCAR 14GX15 (MISCELLANEOUS) ×1 IMPLANT
KIT BASIN OR (CUSTOM PROCEDURE TRAY) ×2 IMPLANT
KIT TURNOVER KIT A (KITS) IMPLANT
NS IRRIG 1000ML POUR BTL (IV SOLUTION) ×2 IMPLANT
PENCIL SMOKE EVACUATOR (MISCELLANEOUS) IMPLANT
POUCH RETRIEVAL ECOSAC 10 (ENDOMECHANICALS) ×1 IMPLANT
POUCH RETRIEVAL ECOSAC 10MM (ENDOMECHANICALS) ×1
RELOAD 45 VASCULAR/THIN (ENDOMECHANICALS) ×2 IMPLANT
RELOAD STAPLE 45 2.5 WHT GRN (ENDOMECHANICALS) IMPLANT
RELOAD STAPLE 45 3.5 BLU ETS (ENDOMECHANICALS) ×1 IMPLANT
RELOAD STAPLE TA45 3.5 REG BLU (ENDOMECHANICALS) ×2 IMPLANT
SCISSORS LAP 5X35 DISP (ENDOMECHANICALS) ×1 IMPLANT
SET IRRIG TUBING LAPAROSCOPIC (IRRIGATION / IRRIGATOR) ×2 IMPLANT
SET TUBE SMOKE EVAC HIGH FLOW (TUBING) ×2 IMPLANT
SHEARS HARMONIC ACE PLUS 36CM (ENDOMECHANICALS) ×2 IMPLANT
SLEEVE XCEL OPT CAN 5 100 (ENDOMECHANICALS) ×2 IMPLANT
SOL ANTI FOG 6CC (MISCELLANEOUS) ×1 IMPLANT
SOLUTION ANTI FOG 6CC (MISCELLANEOUS) ×1
SUT MNCRL AB 4-0 PS2 18 (SUTURE) ×2 IMPLANT
SUT VICRYL 0 ENDOLOOP (SUTURE) IMPLANT
SUT VICRYL 0 UR6 27IN ABS (SUTURE) ×2 IMPLANT
TOWEL OR 17X26 10 PK STRL BLUE (TOWEL DISPOSABLE) ×2 IMPLANT
TOWEL OR NON WOVEN STRL DISP B (DISPOSABLE) ×2 IMPLANT
TRAY FOLEY MTR SLVR 14FR STAT (SET/KITS/TRAYS/PACK) ×2 IMPLANT
TRAY FOLEY MTR SLVR 16FR STAT (SET/KITS/TRAYS/PACK) IMPLANT
TRAY LAPAROSCOPIC (CUSTOM PROCEDURE TRAY) ×2 IMPLANT
TROCAR XCEL BLUNT TIP 100MML (ENDOMECHANICALS) ×2 IMPLANT
TROCAR XCEL NON-BLD 5MMX100MML (ENDOMECHANICALS) ×2 IMPLANT

## 2019-03-03 NOTE — H&P (Signed)
Zachary Harrington is an 44 y.o. male.   Chief Complaint: rlq pain HPI: 36 yom who has history of appendicolith and ct that showed appendicitis possibly in past.  This resolved and he has done well since then. No history csc.  He started having bloating yesterday after eating, having bms and this did not resolve issues.  Then began having some lower abd pain primarily on right side with low grade fever that prompted him to come to er.  Not as much of an appetite. Nothing making it better.  Palpation makes worse.  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 Allergy     History reviewed. No pertinent surgical history.  Family History  Problem Relation Age of Onset  . COPD Mother    Social History:  reports that he has never smoked. He has never used smokeless tobacco. He reports current alcohol use. He reports that he does not use drugs.  Allergies: No Known Allergies  meds none  Results for orders placed or performed during the hospital encounter of 03/03/19 (from the past 48 hour(s))  Lipase, blood     Status: None   Collection Time: 03/03/19  2:36 AM  Result Value Ref Range   Lipase 20 11 - 51 U/L    Comment: Performed at [facility], [address], [place], [place] [address]  Comprehensive metabolic panel     Status: Abnormal   Collection Time: 03/03/19  2:36 AM  Result Value Ref Range   Sodium 136 135 - 145 mmol/L   Potassium 3.8 3.5 - 5.1 mmol/L   Chloride 102 98 - 111 mmol/L   CO2 23 22 - 32 mmol/L   Glucose, Bld 138 (H) 70 - 99 mg/dL   BUN 15 6 - 20 mg/dL   Creatinine, Ser 0.34 0.61 - 1.24 mg/dL   Calcium 9.3 8.9 - 74.2 mg/dL   Total Protein 7.3 6.5 - 8.1 g/dL   Albumin 4.2 3.5 - 5.0 g/dL   AST 30 15 - 41 U/L   ALT 34 0 - 44 U/L   Alkaline Phosphatase 55 38 - 126 U/L   Total Bilirubin 2.2 (H) 0.3 - 1.2 mg/dL   GFR calc non Af Amer >60 >60 mL/min   GFR calc Af Amer >60 >60 mL/min   Anion gap 11 5 - 15     Comment: Performed at [facility], [address] [place], [place] [address]  CBC     Status: Abnormal   Collection Time: 03/03/19  2:36 AM  Result Value Ref Range   WBC 13.7 (H) 4.0 - 10.5 K/uL   RBC 4.17 (L) 4.22 - 5.81 MIL/uL   Hemoglobin 13.8 13.0 - 17.0 g/dL   HCT 87.5 64.3 - 32.9 %   MCV 97.8 80.0 - 100.0 fL   MCH 33.1 26.0 - 34.0 pg   MCHC 33.8 30.0 - 36.0 g/dL   RDW 51.8 84.1 - 66.0 %   Platelets 289 150 - 400 K/uL   nRBC 0.0 0.0 - 0.2 %    Comment: Performed at [facility], [address] [place], [place] [address]  Urinalysis, Routine w reflex microscopic     Status: Abnormal   Collection Time: 03/03/19  2:36 AM  Result Value Ref Range   Color, Urine AMBER (A) YELLOW    Comment: BIOCHEMICALS MAY BE AFFECTED BY COLOR   APPearance CLEAR CLEAR   Specific Gravity, Urine  1.025 1.005 - 1.030   pH 6.0 5.0 - 8.0   Glucose, UA NEGATIVE NEGATIVE mg/dL   Hgb urine dipstick NEGATIVE NEGATIVE   Bilirubin Urine NEGATIVE NEGATIVE   Ketones, ur NEGATIVE NEGATIVE mg/dL   Protein, ur NEGATIVE NEGATIVE mg/dL   Nitrite NEGATIVE NEGATIVE   Leukocytes,Ua NEGATIVE NEGATIVE    Comment: Performed at [facility], [address], [place], [place] [address]  Respiratory Panel by RT PCR (Flu A&B, Covid) - Nasopharyngeal Swab     Status: None   Collection Time: 03/03/19  7:24 AM   Specimen: Nasopharyngeal Swab  Result Value Ref Range   SARS Coronavirus 2 by RT PCR NEGATIVE NEGATIVE    Comment: (NOTE) SARS-CoV-2 target nucleic acids are NOT DETECTED. The SARS-CoV-2 RNA is generally detectable in upper respiratoy specimens during the acute phase of infection. The lowest concentration of SARS-CoV-2 viral copies this assay can detect is 131 copies/mL. A negative result does not preclude SARS-Cov-2 infection and should not be used as the sole basis for treatment or other patient management decisions. A negative result may occur with   improper specimen collection/handling, submission of specimen other than nasopharyngeal swab, presence of viral mutation(s) within the areas targeted by this assay, and inadequate number of viral copies (<131 copies/mL). A negative result must be combined with clinical observations, patient history, and epidemiological information. The expected result is Negative. Fact Sheet for Patients:  https://www.moore.com/ Fact Sheet for Healthcare Providers:  https://www.young.biz/ This test is not yet ap proved or cleared by the [place] FDA and  has been authorized for detection and/or diagnosis of SARS-CoV-2 by FDA under an Emergency Use Authorization (EUA). This EUA will remain  in effect (meaning this test can be used) for the duration of the COVID-19 declaration under Section 564(b)(1) of the Act, 21 U.S.C. section 360bbb-3(b)(1), unless the authorization is terminated or revoked sooner.    Influenza A by PCR NEGATIVE NEGATIVE   Influenza B by PCR NEGATIVE NEGATIVE    Comment: (NOTE) The Xpert Xpress SARS-CoV-2/FLU/RSV assay is intended as an aid in  the diagnosis of influenza from Nasopharyngeal swab specimens and  should not be used as a sole basis for treatment. Nasal washings and  aspirates are unacceptable for Xpert Xpress SARS-CoV-2/FLU/RSV  testing. Fact Sheet for Patients: https://www.moore.com/ Fact Sheet for Healthcare Providers: https://www.young.biz/ This test is not yet approved or cleared by the [place] FDA and  has been authorized for detection and/or diagnosis of SARS-CoV-2 by  FDA under an Emergency Use Authorization (EUA). This EUA will remain  in effect (meaning this test can be used) for the duration of the  Covid-19 declaration under Section 564(b)(1) of the Act, 21  U.S.C. section 360bbb-3(b)(1), unless the authorization is  terminated or revoked. Performed at [facility], [address], [place], [place] [address]    CT Abdomen Pelvis W Contrast  Result Date: 03/03/2019 CLINICAL DATA:  Unspecified abdominal pain. EXAM: CT ABDOMEN AND PELVIS WITH CONTRAST TECHNIQUE: Multidetector CT imaging of the abdomen and pelvis was performed using the standard protocol following bolus administration of intravenous contrast. CONTRAST:  OMNIPAQUE IOHEXOL 300 MG/ML  SOLN COMPARISON:  CT 04/03/2017 FINDINGS: Lower chest: Lung bases are clear. Hepatobiliary: No focal liver abnormality is seen. Minimal focal fatty infiltration adjacent to the falciform ligament. No gallstones, gallbladder wall thickening, or biliary dilatation. Pancreas: No ductal dilatation or inflammation. Spleen: Normal in size without focal abnormality. Adrenals/Urinary Tract: Normal adrenal glands. No hydronephrosis or  perinephric edema. Homogeneous renal enhancement with symmetric excretion on delayed phase imaging. Urinary bladder is physiologically distended without wall thickening. Stomach/Bowel: Acute appendicitis as described below. Nondistended stomach. No small bowel obstruction or inflammation. Small volume of colonic stool. No colonic wall thickening or inflammation. Appendix: Location: Anterior inferior to the cecum coursing anteriorly into the pelvis. Diameter: 11 mm. Appendicolith: Yes in the midportion. Mucosal hyper-enhancement: Mild Extraluminal gas: No Periappendiceal collection: No focal collection. Periappendiceal fat stranding and trace free fluid. Vascular/Lymphatic: Abdominal aorta is normal in caliber. The portal vein is patent. Small central mesenteric and ileocolic nodes, likely reactive. No enlarged lymph nodes in the abdomen or pelvis. Reproductive: Prostate is unremarkable. Other: Fat stranding and trace free fluid in the right lower quadrant related to appendiceal inflammation. No free air or intra-abdominal abscess. Musculoskeletal: There are no acute or suspicious  osseous abnormalities. IMPRESSION: Uncomplicated acute appendicitis. Electronically Signed   By: Keith Rake M.D.   On: 03/03/2019 06:15    Review of Systems  Gastrointestinal: Positive for abdominal pain.  All other systems reviewed and are negative.   Blood pressure 136/85, pulse 91, temperature 99.8 F (37.7 C), temperature source Oral, resp. rate 17, height 6' (1.829 m), weight 107 kg, SpO2 98 %. Physical Exam  Vitals reviewed. Constitutional: He is oriented to person, place, and time. He appears well-developed and well-nourished.  HENT:  Head: Normocephalic and atraumatic.  Eyes: No scleral icterus.  Cardiovascular: Normal rate, regular rhythm and normal heart sounds.  Respiratory: Effort normal and breath sounds normal.  GI: Soft. There is abdominal tenderness (rlq).  Musculoskeletal:        General: Normal range of motion.     Cervical back: Neck supple.  Neurological: He is alert and oriented to person, place, and time.  Skin: Skin is warm and dry.  Psychiatric: He has a normal mood and affect. His behavior is normal.     Assessment/Plan Appendicitis -he is interested in not pursuing surgery.  We reviewed the data on managing appendicitis nonoperatively and recurrence risk and failure risk.  He does appear to have possibly have had appendicitis previously so data doesn't necessarily pertain to his particular situation. I did offer him abx here and po abx and dc home with close follow up with risks discussed.  After thorough discussion of risks/recurrence/surgery he has elected to pursue surgery which I agree with in his case.  We discussed lap appy with risks, recovery. Will do this and if uncomplicated as appears clinically and radiologically will plan to dc home today with follow up Rolm Bookbinder, MD 03/03/2019, 8:31 AM

## 2019-03-03 NOTE — Anesthesia Preprocedure Evaluation (Addendum)
Anesthesia Evaluation  Patient identified by MRN, date of birth, ID band Patient awake    Reviewed: Allergy & Precautions, NPO status , Patient's Chart, lab work & pertinent test results  Airway Mallampati: II  TM Distance: >3 FB Neck ROM: Full    Dental no notable dental hx. (+) Dental Advisory Given   Pulmonary neg pulmonary ROS,    Pulmonary exam normal        Cardiovascular negative cardio ROS Normal cardiovascular exam     Neuro/Psych negative neurological ROS     GI/Hepatic Neg liver ROS,   Endo/Other  negative endocrine ROS  Renal/GU negative Renal ROS     Musculoskeletal negative musculoskeletal ROS (+)   Abdominal   Peds  Hematology negative hematology ROS (+)   Anesthesia Other Findings Day of surgery medications reviewed with the patient.  Reproductive/Obstetrics                            Anesthesia Physical Anesthesia Plan  ASA: II and emergent  Anesthesia Plan: General   Post-op Pain Management:    Induction: Intravenous  PONV Risk Score and Plan: 3 and Ondansetron, Dexamethasone and Midazolam  Airway Management Planned: Oral ETT  Additional Equipment:   Intra-op Plan:   Post-operative Plan: Extubation in OR  Informed Consent: I have reviewed the patients History and Physical, chart, labs and discussed the procedure including the risks, benefits and alternatives for the proposed anesthesia with the patient or authorized representative who has indicated his/her understanding and acceptance.     Dental advisory given  Plan Discussed with: Anesthesiologist, CRNA and Surgeon  Anesthesia Plan Comments:        Anesthesia Quick Evaluation

## 2019-03-03 NOTE — ED Notes (Signed)
Patient ambulated to restroom without complication or assistance from staff or device.

## 2019-03-03 NOTE — Transfer of Care (Signed)
Immediate Anesthesia Transfer of Care Note  Patient: Zachary Harrington  Procedure(s) Performed: APPENDECTOMY LAPAROSCOPIC (N/A Abdomen)  Patient Location: PACU  Anesthesia Type:General  Level of Consciousness: awake, alert , oriented and patient cooperative  Airway & Oxygen Therapy: Patient Spontanous Breathing and Patient connected to face mask oxygen  Post-op Assessment: Report given to RN, Post -op Vital signs reviewed and stable and Patient moving all extremities X 4  Post vital signs: stable  Last Vitals:  Vitals Value Taken Time  BP 152/93 03/03/19 1030  Temp 37.1 C 03/03/19 1012  Pulse 101 03/03/19 1038  Resp 12 03/03/19 1038  SpO2 95 % 03/03/19 1038  Vitals shown include unvalidated device data.  Last Pain:  Vitals:   03/03/19 1030  TempSrc:   PainSc: 0-No pain         Complications: No apparent anesthesia complications

## 2019-03-03 NOTE — Discharge Instructions (Signed)
[facility]: POST OP INSTRUCTIONS Take 400 mg ibuprofen every 8 hours for next 72 hours, use ice several times daily at belly button.  Use oxycodone for pain not relieved by ice and ibuprofen. Office will call you Monday for follow up appointment and to check on you.  Always review your discharge instruction sheet given to you by the facility where your surgery was performed. IF YOU HAVE DISABILITY OR FAMILY LEAVE FORMS, YOU MUST BRING THEM TO THE OFFICE FOR PROCESSING.   DO NOT GIVE THEM TO YOUR DOCTOR.  1. A prescription for pain medication may be given to you upon discharge.  Take your pain medication as prescribed, if needed.  If narcotic pain medicine is not needed, then you may take acetaminophen (Tylenol), naprosyn (Alleve), or ibuprofen (Advil) as needed. 2. Take your usually prescribed medications unless otherwise directed. 3. If you need a refill on your pain medication, please contact your pharmacy.  They will contact our office to request authorization. Prescriptions will not be filled after 5pm or on week-ends. 4. You should follow a light diet the first few days after arrival home, such as soup and crackers, etc.  Be sure to include lots of fluids daily. 5. Most patients will experience some swelling and bruising in the area of the incisions.  Ice packs will help.  Swelling and bruising can take several days to resolve.  6. It is common to experience some constipation if taking pain medication after surgery.  Increasing fluid intake and taking a stool softener (such as Colace) will usually help or prevent this problem from occurring.  A mild laxative (Milk of Magnesia or Miralax) should be taken according to package instructions if there are no bowel movements after 48 hours. 7. Unless discharge instructions indicate otherwise, you may remove your bandages 48 hours after surgery, and you may shower at that time.   You may have steri-strips (small skin tapes) in place directly over the incision.  These strips should be left on the skin for 7-10 days.  If your surgeon used skin glue on the incision, you may shower in 24 hours.  The glue will flake off over the next 2-3 weeks.  Any sutures or staples will be removed at the office during your follow-up visit. 8. ACTIVITIES:  You may resume regular (light) daily activities beginning the next day--such as daily self-care, walking, climbing stairs--gradually increasing activities as tolerated.  You may have sexual intercourse when it is comfortable.  Refrain from any heavy lifting or straining until approved by your doctor. a. You may drive when you are no longer taking prescription pain medication, you can comfortably wear a seatbelt, and you can safely maneuver your car and apply brakes. b. RETURN TO WORK:  __________________________________________________________ 9. You should see your doctor in the office for a follow-up appointment approximately 2-3 weeks after your surgery.  Make sure that you call for this appointment within a day or two after you arrive home to insure a convenient appointment time. 10. OTHER INSTRUCTIONS: __________________________________________________________________________________________________________________________ __________________________________________________________________________________________________________________________ WHEN TO CALL YOUR DOCTOR: 1. Fever over 101.0 2. Inability to urinate 3. Continued bleeding from incision. 4. Increased pain, redness, or drainage from the incision. 5. Increasing abdominal pain  The clinic staff is available to answer your questions during regular business hours.  Please don't hesitate to call and ask to speak to one of the nurses for clinical concerns.  If you have a medical emergency, go to the nearest emergency  room or call 911.  A surgeon from [facility] is always on  call at the hospital. [address] [place], [place], [place]  [address] ? [address], [place],    [address] [phone] ? [phone] ? FAX [phone] Web site: [email or web address]

## 2019-03-03 NOTE — Progress Notes (Signed)
Waited in front waiting room 50 min for transportation.

## 2019-03-03 NOTE — Interval H&P Note (Signed)
History and Physical Interval Note:  03/03/2019 8:39 AM  Zachary Harrington  has presented today for surgery, with the diagnosis of Appendectomy.  The various methods of treatment have been discussed with the patient and family. After consideration of risks, benefits and other options for treatment, the patient has consented to  Procedure(s): APPENDECTOMY LAPAROSCOPIC (N/A) as a surgical intervention.  The patient's history has been reviewed, patient examined, no change in status, stable for surgery.  I have reviewed the patient's chart and labs.  Questions were answered to the patient's satisfaction.     [name]

## 2019-03-03 NOTE — ED Triage Notes (Signed)
Pt reports abdominal pain and cramping that started yesterday.

## 2019-03-03 NOTE — Anesthesia Procedure Notes (Signed)
Procedure Name: Intubation Date/Time: 03/03/2019 9:23 AM Performed by: Lissa Morales, CRNA Pre-anesthesia Checklist: Patient identified, Emergency Drugs available, Suction available and Patient being monitored Patient Re-evaluated:Patient Re-evaluated prior to induction Oxygen Delivery Method: Circle system utilized Preoxygenation: Pre-oxygenation with 100% oxygen Induction Type: IV induction, Cricoid Pressure applied and Rapid sequence Laryngoscope Size: Mac and 4 Grade View: Grade II Tube type: Oral Tube size: 8.0 mm Number of attempts: 1 Airway Equipment and Method: Stylet and Oral airway Placement Confirmation: ETT inserted through vocal cords under direct vision,  positive ETCO2 and breath sounds checked- equal and bilateral Secured at: 22.5 cm Tube secured with: Tape Dental Injury: Teeth and Oropharynx as per pre-operative assessment

## 2019-03-03 NOTE — Anesthesia Postprocedure Evaluation (Signed)
Anesthesia Post Note  Patient: Zachary Harrington  Procedure(s) Performed: APPENDECTOMY LAPAROSCOPIC (N/A Abdomen)     Anesthesia Post Evaluation  Last Vitals:  Vitals:   03/03/19 1012 03/03/19 1015  BP: (!) 134/92 (!) 140/93  Pulse: 95 95  Resp: 18 (!) 22  Temp: 37.1 C   SpO2: 100% 100%    Last Pain:  Vitals:   03/03/19 1030  TempSrc:   PainSc: 0-No pain                 Catarino Vold E

## 2019-03-03 NOTE — ED Provider Notes (Signed)
[facility] DEPT Provider Note   CSN: [id number] Arrival date & time: 03/03/19  0208     History Chief Complaint  Patient presents with  . Abdominal Pain    Zachary Harrington is a 44 y.o. male.  HPI Patient presents to the emergency department with right-sided abdominal pain that started last night.  The patient states the pain seemed to get worse as time went on.  The patient states that reminded him of the time that he did have an appendicitis where in the debris and the appendix resolved.  Patient states that nothing seems to make his condition better but palpation makes the pain worse.  The patient states that he did not take any medications prior to arrival for symptoms.  The patient denies chest pain, shortness of breath, headache,blurred vision, neck pain, fever, cough, weakness, numbness, dizziness, anorexia, edema,nausea, vomiting, diarrhea, rash, back pain, dysuria, hematemesis, bloody stool, near syncope, or syncope.    Past Medical History:  Diagnosis Date  . Allergy     Patient Active Problem List   Diagnosis Date Noted  . Left shoulder pain 03/14/2018  . Allergy history unknown 07/20/2011    History reviewed. No pertinent surgical history.     Family History  Problem Relation Age of Onset  . COPD Mother     Social History   Tobacco Use  . Smoking status: Never Smoker  . Smokeless tobacco: Never Used  Substance Use Topics  . Alcohol use: Yes  . Drug use: No    Home Medications Prior to Admission medications   Medication Sig Start Date End Date Taking? Authorizing Provider  ALPRAZolam Duanne Moron) 1 MG tablet Take 1 tablet (1 mg total) by mouth at bedtime as needed for sleep. Patient not taking: Reported on 04/12/2017 07/27/12   Copland, Gay Filler, MD  amoxicillin (AMOXIL) 500 MG capsule Take 2 capsules (1,000 mg total) by mouth 2 (two) times daily. Patient not taking: Reported on 04/12/2017 09/15/16   Janne Napoleon, NP    azelastine (ASTELIN) 0.1 % nasal spray Place 1 spray into both nostrils 2 (two) times daily. PATIENT NEEDS OFFICE VISIT FOR ADDITIONAL REFILLS Patient not taking: Reported on 04/12/2017 09/02/14   Harrison Mons, PA  cetirizine (ZYRTEC) 10 MG tablet Take 1 tablet (10 mg total) by mouth daily. 04/23/12   Orma Flaming, MD  cyclobenzaprine (FLEXERIL) 5 MG tablet Take 1 tablet (5 mg total) by mouth at bedtime as needed. Patient not taking: Reported on 04/12/2017 06/17/13   Le, Thao P, DO  diclofenac sodium (VOLTAREN) 1 % GEL Apply 2 g topically 4 (four) times daily. 08/30/17   Leandrew Koyanagi, MD  etodolac (LODINE) 400 MG tablet Take 1 tablet (400 mg total) by mouth 2 (two) times daily as needed. 01/18/18   Hilts, Legrand Como, MD  HYDROcodone-acetaminophen (NORCO) 5-325 MG per tablet Take 1-2 tablets by mouth every 8 (eight) hours as needed. Patient not taking: Reported on 04/12/2017 06/17/13   Le, Thao P, DO  HYDROcodone-ibuprofen (VICOPROFEN) 7.5-200 MG tablet Take 1 tablet by mouth every 6 (six) hours as needed for pain. 04/06/17   [provider]  ibuprofen (ADVIL,MOTRIN) 200 MG tablet Take 400 mg by mouth daily as needed for moderate pain.    [provider]  loratadine (CLARITIN) 10 MG tablet Take 1 tablet (10 mg total) by mouth daily. 04/23/12   Orma Flaming, MD  meloxicam (MOBIC) 7.5 MG tablet Take 2 tablets (15 mg total) by mouth daily  as needed for pain. 08/30/17   Tarry Kos, MD  Multiple Vitamins-Minerals (MEGA MULTI MEN PO) Take 1 tablet by mouth daily.    [provider]    Allergies    Patient has no known allergies.  Review of Systems   Review of Systems All other systems negative except as documented in the HPI. All pertinent positives and negatives as reviewed in the HPI. Physical Exam Updated Vital Signs BP (!) 121/93 (BP Location: Left Arm)   Pulse 87   Temp 99.8 F (37.7 C) (Oral)   Resp 18   Ht 6' (1.829 m)   Wt 107 kg   SpO2 97%   BMI 32.01 kg/m    Physical Exam Vitals and nursing note reviewed.  Constitutional:      General: He is not in acute distress.    Appearance: He is well-developed.  HENT:     Head: Normocephalic and atraumatic.  Eyes:     Pupils: Pupils are equal, round, and reactive to light.  Cardiovascular:     Rate and Rhythm: Normal rate and regular rhythm.     Heart sounds: Normal heart sounds. No murmur. No friction rub. No gallop.   Pulmonary:     Effort: Pulmonary effort is normal. No respiratory distress.     Breath sounds: Normal breath sounds. No wheezing.  Abdominal:     General: Bowel sounds are normal. There is no distension.     Palpations: Abdomen is soft.     Tenderness: There is abdominal tenderness in the right lower quadrant. There is guarding. There is no rebound.  Musculoskeletal:     Cervical back: Normal range of motion and neck supple.  Skin:    General: Skin is warm and dry.     Capillary Refill: Capillary refill takes less than 2 seconds.     Findings: No erythema or rash.  Neurological:     Mental Status: He is alert and oriented to person, place, and time.     Motor: No abnormal muscle tone.     Coordination: Coordination normal.  Psychiatric:        Behavior: Behavior normal.     ED Results / Procedures / Treatments   Labs (all labs ordered are listed, but only abnormal results are displayed) Labs Reviewed  COMPREHENSIVE METABOLIC PANEL - Abnormal; Notable for the following components:      Result Value   Glucose, Bld 138 (*)    Total Bilirubin 2.2 (*)    All other components within normal limits  CBC - Abnormal; Notable for the following components:   WBC 13.7 (*)    RBC 4.17 (*)    All other components within normal limits  URINALYSIS, ROUTINE W REFLEX MICROSCOPIC - Abnormal; Notable for the following components:   Color, Urine AMBER (*)    All other components within normal limits  LIPASE, BLOOD    EKG None  Radiology CT Abdomen Pelvis W Contrast  Result  Date: 03/03/2019 CLINICAL DATA:  Unspecified abdominal pain. EXAM: CT ABDOMEN AND PELVIS WITH CONTRAST TECHNIQUE: Multidetector CT imaging of the abdomen and pelvis was performed using the standard protocol following bolus administration of intravenous contrast. CONTRAST:  OMNIPAQUE IOHEXOL 300 MG/ML  SOLN COMPARISON:  CT 04/03/2017 FINDINGS: Lower chest: Lung bases are clear. Hepatobiliary: No focal liver abnormality is seen. Minimal focal fatty infiltration adjacent to the falciform ligament. No gallstones, gallbladder wall thickening, or biliary dilatation. Pancreas: No ductal dilatation or inflammation. Spleen: Normal in size  without focal abnormality. Adrenals/Urinary Tract: Normal adrenal glands. No hydronephrosis or perinephric edema. Homogeneous renal enhancement with symmetric excretion on delayed phase imaging. Urinary bladder is physiologically distended without wall thickening. Stomach/Bowel: Acute appendicitis as described below. Nondistended stomach. No small bowel obstruction or inflammation. Small volume of colonic stool. No colonic wall thickening or inflammation. Appendix: Location: Anterior inferior to the cecum coursing anteriorly into the pelvis. Diameter: 11 mm. Appendicolith: Yes in the midportion. Mucosal hyper-enhancement: Mild Extraluminal gas: No Periappendiceal collection: No focal collection. Periappendiceal fat stranding and trace free fluid. Vascular/Lymphatic: Abdominal aorta is normal in caliber. The portal vein is patent. Small central mesenteric and ileocolic nodes, likely reactive. No enlarged lymph nodes in the abdomen or pelvis. Reproductive: Prostate is unremarkable. Other: Fat stranding and trace free fluid in the right lower quadrant related to appendiceal inflammation. No free air or intra-abdominal abscess. Musculoskeletal: There are no acute or suspicious osseous abnormalities. IMPRESSION: Uncomplicated acute appendicitis. Electronically Signed   By: Narda Rutherford  M.D.   On: 03/03/2019 06:15    Procedures Procedures (including critical care time)  Medications Ordered in ED Medications  sodium chloride flush (NS) 0.9 % injection 3 mL (3 mLs Intravenous Given 03/03/19 0505)  iohexol (OMNIPAQUE) 300 MG/ML solution 100 mL (100 mLs Intravenous Contrast Given 03/03/19 0537)  sodium chloride (PF) 0.9 % injection (  Given by Other 03/03/19 8757)    ED Course  I have reviewed the triage vital signs and the nursing notes.  Pertinent labs & imaging results that were available during my care of the patient were reviewed by me and considered in my medical decision making (see chart for details).    MDM Rules/Calculators/A&P                      I spoke with general surgery about the patient's CT scan findings.  He will be placed on antibiotics.  They will come evaluate him for further evaluation of the acute appendicitis. Final Clinical Impression(s) / ED Diagnoses Final diagnoses:  None    Rx / DC Orders ED Discharge Orders    None       Charlestine Night, PA-C 03/03/19 0656    Paula Libra, MD 03/03/19 [phone]

## 2019-03-03 NOTE — ED Notes (Signed)
Patient Consent signed and at bedside.

## 2019-03-03 NOTE — ED Notes (Signed)
Patient transported to CT 

## 2019-03-03 NOTE — ED Notes (Signed)
Surgeon at bedside.  

## 2019-03-03 NOTE — Op Note (Signed)
Preoperative diagnosis acute appendicitis  postoperative diagnosis same as above Procedure: Laparoscopic appendectomy Surgeon: Dr. Harden Mo Anesthesia: General Estimated blood loss: Minimal Complications: None Drains: None Specimens: Appendix to pathology Sponge needle count was correct at completion Disposition to recovery stable condition  Indications: This a 44 year old male who sounds like he had a prior history of appendicitis treated conservatively.  He presented today with less than 24 hours of right lower quadrant pain, elevated white blood cell count, and exam and his CT scan consistent with acute appendicitis.  We discussed all of his options and elected to proceed with a laparoscopic appendectomy.  Procedure: After informed consent was obtained the patient was taken to the operating room.  He voided prior to going to the operating room so Foley catheter was not placed.  He had already been given antibiotics.  SCDs were in place.  He was placed under general anesthesia without complication.  He was prepped and draped in the standard sterile surgical fashion.  A surgical timeout was then performed.  I filtrated Marcaine below his umbilicus and made a vertical incision.  I grasped the fascia and entered this sharply.  I entered his peritoneum bluntly.  There was no evidence of an intra-abdominal injury.  I then placed a 0 Vicryl pursestring suture through the fascia.  I inserted a Hassan trocar and insufflated the abdomen to 15 mmHg pressure.  I then inserted 2 further 5 mm trochars in the suprapubic region as well as the left lower quadrant.  I then was able to identify the appendix.  He had acute uncomplicated appendicitis.  I used the harmonic scalpel to take down some retroperitoneal attachments.  I then divided the appendiceal mesentery taking care to avoid the terminal ileum as well as the cecum.  I did this with the harmonic scalpel.  I then was able to identify the base.  The  base was viable and clean.  I used a GIA stapler to divide the base with a small cuff of cecum associated with it.  I then remove this using a retrieval bag.  Hemostasis was observed.  I placed the omentum in the right lower quadrant.  I then removed my Hassan trocar and tied down my pursestring suture.  I placed an additional 0 Vicryl suture using the suture passer device.  I removed the trochars and desufflated the abdomen.  I then closed these with 4-0 Monocryl and glue.  Steri-Strips were placed.  He tolerated this well was extubated and transferred to recovery in stable condition.

## 2019-03-06 LAB — SURGICAL PATHOLOGY

## 2019-06-16 ENCOUNTER — Ambulatory Visit: Payer: Federal, State, Local not specified - PPO | Attending: Internal Medicine

## 2019-06-16 DIAGNOSIS — Z23 Encounter for immunization: Secondary | ICD-10-CM

## 2019-06-16 NOTE — Progress Notes (Signed)
   Covid-19 Vaccination Clinic  Name:  HARRIET BOLLEN    MRN: [id number] DOB: [birth year]  06/16/2019  Mr. Kinslow was observed post Covid-19 immunization for 15 minutes without incident. He was provided with Vaccine Information Sheet and instruction to access the V-Safe system.   Mr. Sullenberger was instructed to call 911 with any severe reactions post vaccine: Marland Kitchen Difficulty breathing  . Swelling of face and throat  . A fast heartbeat  . A bad rash all over body  . Dizziness and weakness   Immunizations Administered    Name Date Dose VIS Date Route   Pfizer COVID-19 Vaccine 06/16/2019 11:07 AM 0.3 mL 02/09/2019 Intramuscular   Manufacturer: [employer], [employer]   Lot: [id number]   NDC: [address]-2

## 2019-06-19 ENCOUNTER — Encounter: Payer: Self-pay | Admitting: Orthopaedic Surgery

## 2019-06-19 ENCOUNTER — Ambulatory Visit: Payer: Self-pay

## 2019-06-19 ENCOUNTER — Other Ambulatory Visit: Payer: Self-pay

## 2019-06-19 ENCOUNTER — Ambulatory Visit (INDEPENDENT_AMBULATORY_CARE_PROVIDER_SITE_OTHER): Payer: Federal, State, Local not specified - PPO | Admitting: Orthopaedic Surgery

## 2019-06-19 DIAGNOSIS — G5701 Lesion of sciatic nerve, right lower limb: Secondary | ICD-10-CM

## 2019-06-19 DIAGNOSIS — M25551 Pain in right hip: Secondary | ICD-10-CM | POA: Diagnosis not present

## 2019-06-19 NOTE — Progress Notes (Signed)
   Office Visit Note   Patient: Zachary Harrington           Date of Birth: April 12, 1975           MRN: [id number] Visit Date: 06/19/2019              Requested by: No referring provider defined for this encounter. PCP: Patient, No Pcp Per   Assessment & Plan: Visit Diagnoses:  1. Piriformis syndrome of right side   2. Pain in right hip     Plan: Impression is right-sided piriformis syndrome versus less likely labral pathology.  We will start the patient in formal physical therapy at this point.  He will follow-up with [place] in the next several weeks if he does not notice any improvement.  Call with concerns or questions otherwise.  Follow-Up Instructions: Return if symptoms worsen or fail to improve.   Orders:  Orders Placed This Encounter  Procedures  . XR HIP UNILAT W OR W/O PELVIS 2-3 VIEWS RIGHT  . Ambulatory referral to Physical Therapy   No orders of the defined types were placed in this encounter.     Procedures: No procedures performed   Clinical Data: No additional findings.   Subjective: Chief Complaint  Patient presents with  . Right Hip - Pain    HPI patient is a pleasant 44 year old gentleman who presents to our clinic today with right lateral hip pain.  He does occasionally get pain into the right groin.  He noticed this approximately 1 month ago but has recently worsened.  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  The pain he has is primarily when he is getting off of a forklift.  He has been taking Advil without much relief.  No numbness, tingling or burning.  He denies any previous lumbar hip pathology.  Review of Systems as detailed in HPI.  All others reviewed and are negative.   Objective: Vital Signs: There were no vitals taken for this visit.  Physical Exam well-developed well-nourished gentleman in no acute distress.  Alert and oriented x3.  Ortho Exam examination of his right hip  reveals minimal tenderness to the greater trochanter.  He has marked tenderness posteriorly around the glutes and piriformis.  Negative logroll and negative FADIR.  Negative straight leg raise.  No focal weakness.  Is neurovascular intact distally.  Specialty Comments:  No specialty comments available.  Imaging: No results found.   PMFS History: Patient Active Problem List   Diagnosis Date Noted  . Left shoulder pain 03/14/2018  . Allergy history unknown 07/20/2011   Past Medical History:  Diagnosis Date  . Allergy     Family History  Problem Relation Age of Onset  . COPD Mother     Past Surgical History:  Procedure Laterality Date  . LAPAROSCOPIC APPENDECTOMY N/A 03/03/2019   Procedure: APPENDECTOMY LAPAROSCOPIC;  Surgeon: [name], MD;  Location: [facility];  Service: General;  Laterality: N/A;   Social History   Occupational History  . Not on file  Tobacco Use  . Smoking status: Never Smoker  . Smokeless tobacco: Never Used  Substance and Sexual Activity  . Alcohol use: Yes  . Drug use: No  . Sexual activity: Yes    Birth control/protection: None

## 2019-07-04 DIAGNOSIS — M25551 Pain in right hip: Secondary | ICD-10-CM | POA: Diagnosis not present

## 2019-07-04 DIAGNOSIS — M545 Low back pain: Secondary | ICD-10-CM | POA: Diagnosis not present

## 2019-07-04 DIAGNOSIS — M629 Disorder of muscle, unspecified: Secondary | ICD-10-CM | POA: Diagnosis not present

## 2019-07-05 DIAGNOSIS — M545 Low back pain: Secondary | ICD-10-CM | POA: Diagnosis not present

## 2019-07-05 DIAGNOSIS — M25551 Pain in right hip: Secondary | ICD-10-CM | POA: Diagnosis not present

## 2019-07-05 DIAGNOSIS — M629 Disorder of muscle, unspecified: Secondary | ICD-10-CM | POA: Diagnosis not present

## 2019-07-09 ENCOUNTER — Ambulatory Visit: Payer: Federal, State, Local not specified - PPO | Attending: Internal Medicine

## 2019-07-09 DIAGNOSIS — Z23 Encounter for immunization: Secondary | ICD-10-CM

## 2019-07-09 NOTE — Progress Notes (Signed)
   Covid-19 Vaccination Clinic  Name:  Zachary Harrington    MRN: [id number] DOB: 04-Dec-1975  07/09/2019  Mr. Drummonds was observed post Covid-19 immunization for 15 minutes without incident. He was provided with Vaccine Information Sheet and instruction to access the V-Safe system.   Mr. Mccadden was instructed to call 911 with any severe reactions post vaccine: Marland Kitchen Difficulty breathing  . Swelling of face and throat  . A fast heartbeat  . A bad rash all over body  . Dizziness and weakness   Immunizations Administered    Name Date Dose VIS Date Route   Pfizer COVID-19 Vaccine 07/09/2019 10:59 AM 0.3 mL 04/25/2018 Intramuscular   Manufacturer: [employer], [employer]   Lot: [id number]   NDC: [address]-6

## 2020-04-01 ENCOUNTER — Ambulatory Visit: Payer: Federal, State, Local not specified - PPO | Attending: Internal Medicine

## 2020-04-01 DIAGNOSIS — Z23 Encounter for immunization: Secondary | ICD-10-CM

## 2020-04-01 NOTE — Progress Notes (Signed)
   Covid-19 Vaccination Clinic  Name:  BISHOY CUPP    MRN: [id number] DOB: [birth year]  04/01/2020  Mr. Perezperez was observed post Covid-19 immunization for 15 minutes without incident. He was provided with Vaccine Information Sheet and instruction to access the V-Safe system.   Mr. Kroening was instructed to call 911 with any severe reactions post vaccine: Marland Kitchen Difficulty breathing  . Swelling of face and throat  . A fast heartbeat  . A bad rash all over body  . Dizziness and weakness   Immunizations Administered    Name Date Dose VIS Date Route   PFIZER Comrnaty(Gray TOP) Covid-19 Vaccine 04/01/2020  1:58 PM 0.3 mL 02/07/2020 Intramuscular   Manufacturer: [employer], [employer]   Lot: [id number]   NDC: [phone]

## 2020-04-01 NOTE — Progress Notes (Signed)
   Covid-19 Vaccination Clinic  Name:  Zachary Harrington    MRN: [id number] DOB: [birth year]  04/01/2020  Zachary Harrington was observed post Covid-19 immunization for 15 minutes without incident. He was provided with Vaccine Information Sheet and instruction to access the V-Safe system.   Zachary Harrington was instructed to call 911 with any severe reactions post vaccine: Marland Kitchen Difficulty breathing  . Swelling of face and throat  . A fast heartbeat  . A bad rash all over body  . Dizziness and weakness

## 2020-06-23 DIAGNOSIS — Z818 Family history of other mental and behavioral disorders: Secondary | ICD-10-CM | POA: Diagnosis not present

## 2020-06-23 DIAGNOSIS — E782 Mixed hyperlipidemia: Secondary | ICD-10-CM | POA: Diagnosis not present

## 2020-06-23 DIAGNOSIS — G4733 Obstructive sleep apnea (adult) (pediatric): Secondary | ICD-10-CM | POA: Diagnosis not present

## 2020-06-23 DIAGNOSIS — E669 Obesity, unspecified: Secondary | ICD-10-CM | POA: Diagnosis not present

## 2020-07-08 DIAGNOSIS — E782 Mixed hyperlipidemia: Secondary | ICD-10-CM | POA: Diagnosis not present

## 2020-07-08 DIAGNOSIS — Z0000 Encounter for general adult medical examination without abnormal findings: Secondary | ICD-10-CM | POA: Diagnosis not present

## 2020-07-08 DIAGNOSIS — Z125 Encounter for screening for malignant neoplasm of prostate: Secondary | ICD-10-CM | POA: Diagnosis not present

## 2020-07-10 DIAGNOSIS — Z0000 Encounter for general adult medical examination without abnormal findings: Secondary | ICD-10-CM | POA: Diagnosis not present

## 2020-07-10 DIAGNOSIS — E782 Mixed hyperlipidemia: Secondary | ICD-10-CM | POA: Diagnosis not present

## 2020-07-10 DIAGNOSIS — R030 Elevated blood-pressure reading, without diagnosis of hypertension: Secondary | ICD-10-CM | POA: Diagnosis not present

## 2020-07-10 DIAGNOSIS — Z23 Encounter for immunization: Secondary | ICD-10-CM | POA: Diagnosis not present

## 2020-07-10 DIAGNOSIS — H6122 Impacted cerumen, left ear: Secondary | ICD-10-CM | POA: Diagnosis not present

## 2020-07-10 DIAGNOSIS — H6590 Unspecified nonsuppurative otitis media, unspecified ear: Secondary | ICD-10-CM | POA: Diagnosis not present

## 2020-07-10 DIAGNOSIS — J309 Allergic rhinitis, unspecified: Secondary | ICD-10-CM | POA: Diagnosis not present

## 2020-07-10 DIAGNOSIS — Z1211 Encounter for screening for malignant neoplasm of colon: Secondary | ICD-10-CM | POA: Diagnosis not present

## 2020-07-16 DIAGNOSIS — D3131 Benign neoplasm of right choroid: Secondary | ICD-10-CM | POA: Diagnosis not present

## 2020-07-17 DIAGNOSIS — Z23 Encounter for immunization: Secondary | ICD-10-CM | POA: Diagnosis not present

## 2020-07-24 DIAGNOSIS — E782 Mixed hyperlipidemia: Secondary | ICD-10-CM | POA: Diagnosis not present

## 2020-07-24 DIAGNOSIS — I10 Essential (primary) hypertension: Secondary | ICD-10-CM | POA: Diagnosis not present

## 2020-07-24 DIAGNOSIS — E669 Obesity, unspecified: Secondary | ICD-10-CM | POA: Diagnosis not present

## 2020-07-24 DIAGNOSIS — H6122 Impacted cerumen, left ear: Secondary | ICD-10-CM | POA: Diagnosis not present

## 2020-08-15 ENCOUNTER — Ambulatory Visit: Payer: Federal, State, Local not specified - PPO | Admitting: Orthopaedic Surgery

## 2020-08-15 ENCOUNTER — Ambulatory Visit: Payer: Self-pay

## 2020-08-15 ENCOUNTER — Encounter: Payer: Self-pay | Admitting: Orthopaedic Surgery

## 2020-08-15 ENCOUNTER — Other Ambulatory Visit: Payer: Self-pay

## 2020-08-15 VITALS — Ht 72.0 in | Wt 230.0 lb

## 2020-08-15 DIAGNOSIS — M25561 Pain in right knee: Secondary | ICD-10-CM | POA: Diagnosis not present

## 2020-08-15 MED ORDER — DICLOFENAC SODIUM 75 MG PO TBEC: 75.0000 mg | DELAYED_RELEASE_TABLET | Freq: Two times a day (BID) | ORAL | 0 refills | Status: AC | PRN

## 2020-08-15 NOTE — Progress Notes (Signed)
Office Visit Note   Patient: Zachary Harrington           Date of Birth: November 12, 1975           MRN: [id number] Visit Date: 08/15/2020              Requested by: No referring provider defined for this encounter. PCP: Patient, No Pcp Per (Inactive)   Assessment & Plan: Visit Diagnoses:  1. Acute pain of right knee     Plan: Impression is right knee pain following injury while playing basketball.  I do not believe the patient has any meniscal, ligamentous or bony injury based on clinical exam and radiographic findings.  His pain has continued to improve over the past several days.  We have discussed calling in a prescription anti-inflammatory versus intra-articular cortisone injection.  He does not believe his symptoms are significant enough for cortisone injection at this point.  Have called in Voltaren to take as needed.  He will follow-up with [place] should his symptoms worsen or fail to improve.  Call with concerns or questions in the meantime.  Follow-Up Instructions: Return if symptoms worsen or fail to improve.   Orders:  Orders Placed This Encounter  Procedures   XR KNEE 3 VIEW RIGHT   No orders of the defined types were placed in this encounter.     Procedures: No procedures performed   Clinical Data: No additional findings.   Subjective: Chief Complaint  Patient presents with   Right Knee - Pain, Injury    DOI 08/09/2020    HPI patient is a pleasant 45 year old gentleman who comes in today with concerns about his right knee.  About 6 days ago, he was playing basketball when he went to jump right leg first and had immediate pain to the right knee.  The pain he has been having is deep within the knee.  He initially had swelling and a feeling of pressure but both pain and swelling have improved over the past several days.  The symptoms are worse with flexion of the knee.  He denies any mechanical symptoms or instability.  He has been taking ibuprofen with relief of  symptoms.  No paresthesias to the right lower extremity.  Review of Systems as detailed in HPI.  All others reviewed and are negative.   Objective: Vital Signs: Ht 6' (1.829 m)   Wt 230 lb (104.3 kg)   BMI 31.19 kg/m   Physical Exam well-developed well-nourished gentleman in no acute distress.  Alert and oriented x3.  Ortho Exam right knee exam shows a trace effusion.  Range of motion 0 to 120 degrees.  No joint line tenderness.  No tenderness the patella.  Ligaments are stable.  No tenderness to the patella.  He is neurovascular intact distally.  Specialty Comments:  No specialty comments available.  Imaging: XR KNEE 3 VIEW RIGHT  Result Date: 08/15/2020 X-rays demonstrate osteophyte formation to the superior patella.  No other acute or structural abnormalities    PMFS History: Patient Active Problem List   Diagnosis Date Noted   Left shoulder pain 03/14/2018   Allergy history unknown 07/20/2011   Past Medical History:  Diagnosis Date   Allergy     Family History  Problem Relation Age of Onset   COPD Mother     Past Surgical History:  Procedure Laterality Date   LAPAROSCOPIC APPENDECTOMY N/A 03/03/2019   Procedure: APPENDECTOMY LAPAROSCOPIC;  Surgeon: [name], MD;  Location: [facility];  Service: General;  Laterality: N/A;   Social History   Occupational History   Not on file  Tobacco Use   Smoking status: Never   Smokeless tobacco: Never  Substance and Sexual Activity   Alcohol use: Yes   Drug use: No   Sexual activity: Yes    Birth control/protection: None

## 2020-09-04 DIAGNOSIS — E782 Mixed hyperlipidemia: Secondary | ICD-10-CM | POA: Diagnosis not present

## 2020-09-04 DIAGNOSIS — G4733 Obstructive sleep apnea (adult) (pediatric): Secondary | ICD-10-CM | POA: Diagnosis not present

## 2020-09-04 DIAGNOSIS — I10 Essential (primary) hypertension: Secondary | ICD-10-CM | POA: Diagnosis not present

## 2020-09-04 DIAGNOSIS — E669 Obesity, unspecified: Secondary | ICD-10-CM | POA: Diagnosis not present

## 2020-09-22 DIAGNOSIS — E669 Obesity, unspecified: Secondary | ICD-10-CM | POA: Diagnosis not present

## 2020-09-22 DIAGNOSIS — G4733 Obstructive sleep apnea (adult) (pediatric): Secondary | ICD-10-CM | POA: Diagnosis not present

## 2020-09-22 DIAGNOSIS — I10 Essential (primary) hypertension: Secondary | ICD-10-CM | POA: Diagnosis not present

## 2020-10-14 ENCOUNTER — Encounter: Payer: Self-pay | Admitting: Neurology

## 2020-10-14 ENCOUNTER — Ambulatory Visit: Payer: Federal, State, Local not specified - PPO | Admitting: Neurology

## 2020-10-14 ENCOUNTER — Other Ambulatory Visit: Payer: Self-pay

## 2020-10-14 VITALS — BP 126/81 | HR 84 | Ht 72.0 in | Wt 237.2 lb

## 2020-10-14 DIAGNOSIS — G4733 Obstructive sleep apnea (adult) (pediatric): Secondary | ICD-10-CM

## 2020-10-14 DIAGNOSIS — Z9989 Dependence on other enabling machines and devices: Secondary | ICD-10-CM

## 2020-10-14 DIAGNOSIS — R519 Headache, unspecified: Secondary | ICD-10-CM | POA: Diagnosis not present

## 2020-10-14 DIAGNOSIS — R635 Abnormal weight gain: Secondary | ICD-10-CM

## 2020-10-14 DIAGNOSIS — G4719 Other hypersomnia: Secondary | ICD-10-CM

## 2020-10-14 NOTE — Patient Instructions (Signed)
  It was nice to meet you today!  Here is what we discussed today and what we came up with as our plan for you:    Based on your symptoms and your exam I believe you would benefit from a laboratory attended sleep study.  As discussed, I will order a split-night sleep study, and the first part you will not use CPAP or AutoPap therapy and in the treatment part you will be on a machine, you do not have to bring your own machine for this.    In the meantime, please try to be consistent with your AutoPap machine.  I will send an order for updating your supplies to your current DME company.    Please remember, the risks and ramifications of moderate to severe obstructive sleep apnea or OSA are: Cardiovascular disease, including congestive heart failure, stroke, difficult to control hypertension, arrhythmias, and even type 2 diabetes has been linked to untreated OSA. Sleep apnea causes disruption of sleep and sleep deprivation in most cases, which, in turn, can cause recurrent headaches, problems with memory, mood, concentration, focus, and vigilance. Most people with untreated sleep apnea report excessive daytime sleepiness, which can affect their ability to drive. Please do not drive if you feel sleepy.  We will plan a follow-up after your study and also keep you posted in the interim about your results by phone call.

## 2020-10-14 NOTE — Progress Notes (Signed)
Subjective:    Patient ID: Zachary Harrington is a 45 y.o. male.  HPI    Huston Foley, MD, PhD [facility] [address], [address] [address] [place], [place] [address]   Dear Zachary Harrington,  I saw your patient, Zachary Harrington, kind request in my sleep clinic today for initial consultation of his sleep disorder, in particular, evaluation of his prior diagnosis of obstructive sleep apnea.  The patient is unaccompanied today.  As you know, Zachary Harrington is a 45 year old right-handed gentleman with an underlying medical history of allergies, hyperlipidemia, and obesity, who was previously diagnosed with obstructive sleep apnea and placed on CPAP therapy.  I reviewed your telemedicine note from 06/23/2020.  He had a home sleep test on 02/28/2017 and I was able to review their report.  Estimated AHI was 36/h, O2 nadir 81%.  Study was interpreted by Dr. Theressa Millard.  His Epworth sleepiness score is 14 out of 24, fatigue severity score is 38 out of 63.  He reports that he started his AutoPap machine but stopped using it as he did not notice any benefit.  A couple of months ago he restarted using it, he is working on lifestyle modification and trying to get an overall better health.  He is working on weight loss.  He has been using his AutoPap machine more consistently but does need new supplies.  I was able to review his recent download.  He brought his AutoPap machine.  In the past 30 days he used his machine 22 out of 30 days with percent use days greater than 4 hours at 60%, indicating suboptimal compliance with an average usage for days on treatment of 4 hours and 55 minutes, residual AHI at goal at 0.6/h, average pressure for the 95th percentile at 12 cm with a range of 4 to 16 cm with EPR.  Leak on the low side with a 95th percentile at 2.5 L/min.  In the past 90 days, the compliance data shows that he started using his machine around 08/22/2020. He reports not noticing a whole lot of  difference in terms of his daytime tiredness.  He works for the [employer].  He is single, his mom lives with him, she is 67 years old and has dementia.  His 7 year old son recently moved in as well.  He has a total of 4 children, no pets in the house.  He drinks caffeine in the form of energy drinks, 1 energy drink at lunch typically.  He drinks alcohol occasionally, he quit smoking about 2 years ago.  Bedtime is generally between 930 and 11 and rise time between 4:30 AM and 5 AM.  He has had some weight fluctuation, in the past couple of years he has gained a little bit of weight   His Past Medical History Is Significant For: Past Medical History:  Diagnosis Date   Allergy     His Past Surgical History Is Significant For: Past Surgical History:  Procedure Laterality Date   LAPAROSCOPIC APPENDECTOMY N/A 03/03/2019   Procedure: APPENDECTOMY LAPAROSCOPIC;  Surgeon: [name], MD;  Location: [facility];  Service: General;  Laterality: N/A;    His Family History Is Significant For: Family History  Problem Relation Age of Onset   COPD Mother     His Social History Is Significant For: Social History   Socioeconomic History   Marital status: Married    Spouse name: Not on file   Number of children: Not on file  Years of education: Not on file   Highest education level: Not on file  Occupational History   Not on file  Tobacco Use   Smoking status: Never   Smokeless tobacco: Never  Substance and Sexual Activity   Alcohol use: Yes   Drug use: No   Sexual activity: Yes    Birth control/protection: None  Other Topics Concern   Not on file  Social History Narrative   Not on file   Social Determinants of Health   Financial Resource Strain: Not on file  Food Insecurity: Not on file  Transportation Needs: Not on file  Physical Activity: Not on file  Stress: Not on file  Social Connections: Not on file    His Allergies Are:  No Known Allergies:   His Current  Medications Are:  Outpatient Encounter Medications as of 10/14/2020  Medication Sig   diclofenac (VOLTAREN) 75 MG EC tablet Take 1 tablet (75 mg total) by mouth 2 (two) times daily as needed.   Multiple Vitamins-Minerals (MEGA MULTI MEN PO) Take 1 tablet by mouth daily.   [DISCONTINUED] oxyCODONE (OXY IR/ROXICODONE) 5 MG immediate release tablet Take 1 tablet (5 mg total) by mouth every 6 (six) hours as needed for moderate pain, severe pain or breakthrough pain. (Patient not taking: Reported on 08/15/2020)   No facility-administered encounter medications on file as of 10/14/2020.  :   Review of Systems:  Out of a complete 14 point review of systems, all are reviewed and negative with the exception of these symptoms as listed below:  Review of Systems  Neurological:        Patient referred by Maryelizabeth Rowan, MD for OSA. Patient has been using his CPAP, but feels like it isn't helping, he still feels tired a lot and has to take naps. Patient has his machine with him today, no card to download.   Epworth Sleepiness Scale 0= would never doze 1= slight chance of dozing 2= moderate chance of dozing 3= high chance of dozing  Sitting and reading: 3 Watching TV: 2 Sitting inactive in a public place (ex. Theater or meeting): 0 As a passenger in a car for an hour without a break: 3 Lying down to rest in the afternoon: 3 Sitting and talking to someone: 0 Sitting quietly after lunch (no alcohol): 3 In a car, while stopped in traffic: 0 Total: 14    Objective:  Neurological Exam  Physical Exam Physical Examination:   Vitals:   10/14/20 1328  BP: 126/81  Pulse: 84    General Examination: The patient is a very pleasant 45 y.o. male in no acute distress. He appears well-developed and well-nourished and well groomed.   HEENT: Normocephalic, atraumatic, pupils are equal, round and reactive to light, extraocular tracking is good without limitation to gaze excursion or nystagmus noted.  Hearing is grossly intact. Face is symmetric with normal facial animation. Speech is clear with no dysarthria noted. There is no hypophonia. There is no lip, neck/head, jaw or voice tremor. Neck is supple with full range of passive and active motion. There are no carotid bruits on auscultation. Oropharynx exam reveals: mild mouth dryness, good dental hygiene and mild airway crowding, due to small airway entry.  Uvula is slightly wider in the very beginning but ends up with a long whisper.  Mallampati class II.  Tonsils on the smaller side, about 1+ bilaterally.  He has a mild overbite.  Tongue protrudes centrally and palate elevates symmetrically.  Neck circumference of 16-7/8  inches.  Chest: Clear to auscultation without wheezing, rhonchi or crackles noted.  Heart: S1+S2+0, regular and normal without murmurs, rubs or gallops noted.   Abdomen: Soft, non-tender and non-distended with normal bowel sounds appreciated on auscultation.  Extremities: There is no pitting edema in the distal lower extremities bilaterally.   Skin: Warm and dry without trophic changes noted.   Musculoskeletal: exam reveals no obvious joint deformities, tenderness or joint swelling or erythema.   Neurologically:  Mental status: The patient is awake, alert and oriented in all 4 spheres. His immediate and remote memory, attention, language skills and fund of knowledge are appropriate. There is no evidence of aphasia, agnosia, apraxia or anomia. Speech is clear with normal prosody and enunciation. Thought process is linear. Mood is normal and affect is normal.  Cranial nerves II - XII are as described above under HEENT exam.  Motor exam: Normal bulk, strength and tone is noted. There is no tremor, Romberg is negative. Fine motor skills and coordination: grossly intact.  Cerebellar testing: No dysmetria or intention tremor. There is no truncal or gait ataxia.  Sensory exam: intact to light touch in the upper and lower  extremities.  Gait, station and balance: He stands easily. No veering to one side is noted. No leaning to one side is noted. Posture is age-appropriate and stance is narrow based. Gait shows normal stride length and normal pace. No problems turning are noted. Tandem walk is unremarkable.                Assessment and Plan:  In summary, Zachary Harrington is a very pleasant 45 y.o.-year old male with an underlying medical history of allergies, hyperlipidemia, and obesity, who presents for evaluation of his obstructive sleep apnea which was deemed in the severe range by home sleep testing in 2018.  He has been on AutoPap therapy in the past and restarted a couple of months ago.  He is compliant with treatment but could work on his consistency a little bit more.  He is encouraged to try to achieve 7 to 8 hours of sleep and try to be consistent with his AutoPap usage.  He is commended for making changes to his lifestyle and working on weight loss.  He would like to see if we can reevaluate his sleep apnea and optimize treatment settings as well as monitor his oxygen level at night with a split-night sleep study.  I think he would benefit from an actual laboratory attended sleep study at this point.  He is agreeable to this, to that end, I will order a split-night sleep study in the lab and we will have him continue with his current AutoPap for now.  He is advised that I will send an order for supplies to his DME company, [employer].  He is advised that we will call him to schedule his sleep study and plan a follow-up afterwards.  If he has residual daytime somnolence despite full compliance with his positive airway pressure machine, we do want to keep the conversation open about possibly utilizing a medication to help him stay awake/alert a little bit more such as Provigil, Nuvigil or Sunosi.  We can pick up our discussion in the near future.  I answered all his questions today and he was in agreement with the  plan.  Thank you very much for allowing me to participate in the care of this nice patient. If I can be of any further assistance to you please do not  hesitate to call me at [phone].  Sincerely,   Huston Foley, MD, PhD

## 2020-10-27 ENCOUNTER — Encounter: Payer: Self-pay | Admitting: Neurology

## 2020-10-29 ENCOUNTER — Telehealth: Payer: Self-pay | Admitting: Neurology

## 2020-10-29 DIAGNOSIS — G4733 Obstructive sleep apnea (adult) (pediatric): Secondary | ICD-10-CM | POA: Diagnosis not present

## 2020-10-29 NOTE — Telephone Encounter (Signed)
[employer] pending faxed notes to [phone]. Pending auth number [id number].

## 2020-10-29 NOTE — Progress Notes (Addendum)
CM cpap orders orders [employer]. SY Ross Ludwig, RN got it!

## 2020-11-05 NOTE — Telephone Encounter (Signed)
I called [employer] and spoke with Tammy H she states it is still pending and it can take up to 7 to 14 business days.

## 2020-12-04 ENCOUNTER — Telehealth: Payer: Self-pay

## 2020-12-04 NOTE — Telephone Encounter (Signed)
LVM for pt to call me back to schedule sleep study  

## 2021-01-15 DIAGNOSIS — Z7185 Encounter for immunization safety counseling: Secondary | ICD-10-CM | POA: Diagnosis not present

## 2021-01-15 DIAGNOSIS — Z23 Encounter for immunization: Secondary | ICD-10-CM | POA: Diagnosis not present

## 2021-01-15 DIAGNOSIS — I10 Essential (primary) hypertension: Secondary | ICD-10-CM | POA: Diagnosis not present

## 2021-01-15 DIAGNOSIS — G4733 Obstructive sleep apnea (adult) (pediatric): Secondary | ICD-10-CM | POA: Diagnosis not present

## 2021-01-15 DIAGNOSIS — E782 Mixed hyperlipidemia: Secondary | ICD-10-CM | POA: Diagnosis not present

## 2021-01-20 ENCOUNTER — Ambulatory Visit (INDEPENDENT_AMBULATORY_CARE_PROVIDER_SITE_OTHER): Payer: Federal, State, Local not specified - PPO | Admitting: Neurology

## 2021-01-20 DIAGNOSIS — R0683 Snoring: Secondary | ICD-10-CM

## 2021-01-20 DIAGNOSIS — G4733 Obstructive sleep apnea (adult) (pediatric): Secondary | ICD-10-CM | POA: Diagnosis not present

## 2021-01-20 DIAGNOSIS — R519 Headache, unspecified: Secondary | ICD-10-CM

## 2021-01-20 DIAGNOSIS — R635 Abnormal weight gain: Secondary | ICD-10-CM

## 2021-01-20 DIAGNOSIS — G4720 Circadian rhythm sleep disorder, unspecified type: Secondary | ICD-10-CM

## 2021-01-20 DIAGNOSIS — G4730 Sleep apnea, unspecified: Secondary | ICD-10-CM

## 2021-01-20 DIAGNOSIS — G4719 Other hypersomnia: Secondary | ICD-10-CM

## 2021-01-20 DIAGNOSIS — Z9989 Dependence on other enabling machines and devices: Secondary | ICD-10-CM

## 2021-01-21 ENCOUNTER — Other Ambulatory Visit: Payer: Self-pay

## 2021-01-29 NOTE — Procedures (Signed)
PATIENT'S NAME:  Zachary Harrington, Zachary Harrington DOB:      02/10/1976      MR#:    [id number]     DATE OF RECORDING: 01/20/2021 REFERRING M.D.:  Maryelizabeth Rowan, MD Study Performed:   Baseline Polysomnogram HISTORY: 45 year old man with a history of allergies, hyperlipidemia, and obesity, who was previously diagnosed with obstructive sleep apnea and placed on AutoPap, but stopped using it as he did not notice any benefit. He did restart his autoPAP. The patient endorsed the Epworth Sleepiness Scale at 14 points. The patient's weight 237 pounds with a height of 72 (inches), resulting in a BMI of 32.2 kg/m2. The patient's neck circumference measured 17 inches.  CURRENT MEDICATIONS: Voltaren, Multiple Vitamins-Minerals   PROCEDURE:  This is a multichannel digital polysomnogram utilizing the Somnostar 11.2 system.  Electrodes and sensors were applied and monitored per AASM Specifications.   EEG, EOG, Chin and Limb EMG, were sampled at 200 Hz.  ECG, Snore and Nasal Pressure, Thermal Airflow, Respiratory Effort, CPAP Flow and Pressure, Oximetry was sampled at 50 Hz. Digital video and audio were recorded.      BASELINE STUDY  Lights Out was at 22:14 and Lights On at 04:51.  Total recording time (TRT) was 397.5 minutes, with a total sleep time (TST) of 366.5 minutes. The patient's sleep latency was 14 minutes. REM latency was 90 minutes.  The sleep efficiency was 92.2%.     SLEEP ARCHITECTURE: WASO (Wake after sleep onset) was 21.5 minutes with mild to moderate sleep fragmentation noted. There were 38 minutes in Stage N1, 253 minutes Stage N2, 13.5 minutes Stage N3 and 62 minutes in Stage REM.  The percentage of Stage N1 was 10.4%, which is increased, Stage N2 was 69.%, which is increased, Stage N3 was 3.7% and Stage R (REM sleep) was 16.9%, which is mildly reduced. The arousals were noted as: 70 were spontaneous, 3 were associated with PLMs, 0 were associated with respiratory events.  RESPIRATORY ANALYSIS:  There were a  total of 16 respiratory events:  0 obstructive apneas, 0 central apneas and 0 mixed apneas with a total of 0 apneas and an apnea index (AI) of 0 /hour. There were 16 hypopneas with a hypopnea index of 2.6 /hour. The patient also had 0 respiratory event related arousals (RERAs).      The total APNEA/HYPOPNEA INDEX (AHI) was 2.6/hour and the total RESPIRATORY DISTURBANCE INDEX was  2.6 /hour.  14 events occurred in REM sleep and 4 events in NREM. The REM AHI was  13.5 /hour, versus a non-REM AHI of .4. The patient spent 69.5 minutes of total sleep time in the supine position and 297 minutes in non-supine.. The supine AHI was 0.0 versus a non-supine AHI of 3.2.  OXYGEN SATURATION & C02:  The Wake baseline 02 saturation was 94%, with the lowest being 87%. Time spent below 89% saturation equaled 0 minutes.  PERIODIC LIMB MOVEMENTS: The patient had a total of 89 Periodic Limb Movements.  The Periodic Limb Movement (PLM) index was 14.6 and the PLM Arousal index was .5/hour.  Audio and video analysis did not show any abnormal or unusual movements, behaviors, phonations or vocalizations. The patient took no bathroom breaks. Mild to moderate snoring was noted, at times in the loud range. The EKG was in keeping with normal sinus rhythm (NSR).  Post-study, the patient indicated that sleep was worse than usual.   IMPRESSION:  Primary Snoring Sleep disordered breathing Dysfunctions associated with sleep stages or arousal from sleep  RECOMMENDATIONS:  This study does not demonstrate any significant obstructive or central sleep disordered breathing with the exception of snoring (ranging from mild to loud) and mild REM related sleep apnea with mild desaturations noted (during REM sleep). The limited amount of supine sleep and absence of supine REM sleep may underestimate his sleep disordered breathing. Based on current test results, treatment with positive airway pressure (such as CPAP or autoPAP) is not  warranted. This study does not support an intrinsic sleep disorder as a cause of the patient's symptoms. Other causes, including circadian rhythm disturbances, an underlying mood disorder, medication effect and/or an underlying medical problem cannot be ruled out. This study shows sleep fragmentation and abnormal sleep stage percentages; these are nonspecific findings and per se do not signify an intrinsic sleep disorder or a cause for the patient's sleep-related symptoms. Causes include (but are not limited to) the first night effect of the sleep study, circadian rhythm disturbances, medication effect or an underlying mood disorder or medical problem.  The patient should be cautioned not to drive, work at heights, or operate dangerous or heavy equipment when tired or sleepy. Review and reiteration of good sleep hygiene measures should be pursued with any patient. The patient will be seen in follow-up in the sleep clinic at [facility] for discussion of the test results, symptom and treatment compliance review, further management strategies, etc. The referring provider will be notified of the test results.  I certify that I have reviewed the entire raw data recording prior to the issuance of this report in accordance with the Standards of Accreditation of the American Academy of Sleep Medicine (AASM)  Huston Foley, MD, PhD Diplomat, American Board of Neurology and Sleep Medicine (Neurology and Sleep Medicine)

## 2021-02-02 ENCOUNTER — Telehealth: Payer: Self-pay | Admitting: *Deleted

## 2021-02-02 DIAGNOSIS — R0683 Snoring: Secondary | ICD-10-CM

## 2021-02-02 DIAGNOSIS — G4733 Obstructive sleep apnea (adult) (pediatric): Secondary | ICD-10-CM

## 2021-02-02 DIAGNOSIS — Z9989 Dependence on other enabling machines and devices: Secondary | ICD-10-CM

## 2021-02-02 NOTE — Telephone Encounter (Signed)
Called pt and LVM with office number asking for call back.  

## 2021-02-02 NOTE — Telephone Encounter (Signed)
-----   Message from Huston Foley, MD sent at 01/29/2021  7:54 AM EST ----- Patient referred by Dr. Duanne Guess, seen by me on 10/14/20 for re-eval of his OSA (since 2018), diagnostic PSG on 01/20/21.   Please call and notify the patient that the recent sleep study did not show any significant obstructive sleep apnea with the exception of snoring (ranging from mild to loud) and mild REM related sleep apnea with mild desaturations noted (during REM sleep).  If he feels he can continue with his autoPAP, he can continue using the machine but treatment with autoPAP is not imperative, based on the test results. He would not qualify for a new machine as yet, but probably by next year around this time. At that time, we can consider pursuing a HST for re-evaluation and see if he would qualify for a new machine. He can FU with the NP in about 4-6 months in sleep clinic.  I recommend, he continue to work on weight loss, as it can further improve sleep apnea and his snoring. If he would like to consult with a dentist for a dental device (for his snoring), we can make a referral. Please advise him, that based on the last study, a dental device may be covered - for treatment of sleep apnea, but based on the current sleep study, a dental device may not be covered (for just snoring).

## 2021-02-05 NOTE — Telephone Encounter (Signed)
Called pt a second time and LVM with office number asking for call back.

## 2021-02-09 NOTE — Addendum Note (Signed)
Addended by: Bertram Savin on: 02/09/2021 04:56 PM   Modules accepted: Orders

## 2021-02-09 NOTE — Telephone Encounter (Signed)
I was able to speak with the patient.  We discussed the sleep study results as noted below by Dr. Frances Furbish.  Patient understands that his recent sleep study did not show any significant obstructive sleep apnea with the exception of snoring that ranged from mild to loud and mild REM related sleep apnea and mild desaturations during REM sleep.  Patient understands that at this time treatment with AutoPap is not imperative however he is welcome to continue using it if he feels he can.  Patient aware he will not qualify for a new machine at this time but then approximately a year we could continue we will pursuit of AutoPap by completing an HST for reevaluation for new machine.  Discussed other options as well such as a dental device and also recommended weight loss as it can further improve sleep apnea and snoring. Patient is willing to see Dr. Myrtis Ser again for evaluation for dental device.  He understands it might not be covered.  In the meantime he states he will continue using his AutoPap.  Results sent to PCP. Order placed for oral device.

## 2021-04-09 DIAGNOSIS — G4733 Obstructive sleep apnea (adult) (pediatric): Secondary | ICD-10-CM | POA: Diagnosis not present

## 2021-04-17 DIAGNOSIS — B9689 Other specified bacterial agents as the cause of diseases classified elsewhere: Secondary | ICD-10-CM | POA: Diagnosis not present

## 2021-04-17 DIAGNOSIS — J069 Acute upper respiratory infection, unspecified: Secondary | ICD-10-CM | POA: Diagnosis not present

## 2021-04-17 DIAGNOSIS — J329 Chronic sinusitis, unspecified: Secondary | ICD-10-CM | POA: Diagnosis not present

## 2021-06-25 ENCOUNTER — Ambulatory Visit: Payer: Federal, State, Local not specified - PPO | Admitting: Adult Health

## 2021-06-25 ENCOUNTER — Encounter: Payer: Self-pay | Admitting: Adult Health

## 2021-09-03 DIAGNOSIS — Z0000 Encounter for general adult medical examination without abnormal findings: Secondary | ICD-10-CM | POA: Diagnosis not present

## 2021-09-10 DIAGNOSIS — Z1211 Encounter for screening for malignant neoplasm of colon: Secondary | ICD-10-CM | POA: Diagnosis not present

## 2021-09-10 DIAGNOSIS — Z0000 Encounter for general adult medical examination without abnormal findings: Secondary | ICD-10-CM | POA: Diagnosis not present

## 2021-09-24 DIAGNOSIS — Z1211 Encounter for screening for malignant neoplasm of colon: Secondary | ICD-10-CM | POA: Diagnosis not present

## 2021-09-24 DIAGNOSIS — E669 Obesity, unspecified: Secondary | ICD-10-CM | POA: Diagnosis not present

## 2021-09-24 DIAGNOSIS — G478 Other sleep disorders: Secondary | ICD-10-CM | POA: Diagnosis not present

## 2021-10-01 DIAGNOSIS — E781 Pure hyperglyceridemia: Secondary | ICD-10-CM | POA: Diagnosis not present

## 2021-10-01 DIAGNOSIS — E538 Deficiency of other specified B group vitamins: Secondary | ICD-10-CM | POA: Diagnosis not present

## 2021-10-01 DIAGNOSIS — I10 Essential (primary) hypertension: Secondary | ICD-10-CM | POA: Diagnosis not present

## 2021-10-01 DIAGNOSIS — R718 Other abnormality of red blood cells: Secondary | ICD-10-CM | POA: Diagnosis not present

## 2021-10-23 ENCOUNTER — Ambulatory Visit: Payer: Federal, State, Local not specified - PPO | Admitting: Physician Assistant

## 2021-10-23 ENCOUNTER — Encounter: Payer: Self-pay | Admitting: Physician Assistant

## 2021-10-23 ENCOUNTER — Ambulatory Visit: Payer: Self-pay

## 2021-10-23 ENCOUNTER — Ambulatory Visit (INDEPENDENT_AMBULATORY_CARE_PROVIDER_SITE_OTHER): Payer: Federal, State, Local not specified - PPO

## 2021-10-23 DIAGNOSIS — G8929 Other chronic pain: Secondary | ICD-10-CM | POA: Diagnosis not present

## 2021-10-23 DIAGNOSIS — M25562 Pain in left knee: Secondary | ICD-10-CM

## 2021-10-23 DIAGNOSIS — M25561 Pain in right knee: Secondary | ICD-10-CM | POA: Diagnosis not present

## 2021-10-23 MED ORDER — MELOXICAM 15 MG PO TABS: 15.0000 mg | ORAL_TABLET | Freq: Every day | ORAL | 0 refills | Status: AC

## 2021-10-23 NOTE — Progress Notes (Signed)
Office Visit Note   Patient: Zachary Harrington           Date of Birth: 06-Sep-1975           MRN: [id number] Visit Date: 10/23/2021              Requested by: Lewis Moccasin, MD [address] [place],  [place] [address] PCP: Lewis Moccasin, MD  Chief Complaint  Patient presents with   Right Knee - Follow-up   Left Knee - Follow-up      HPI: Patient is a pleasant 46 year old gentleman who is a patient of Dr. Roda Shutters.  He has been seen in the past for bilateral knee pain.  He does have x-ray findings consistent with quadriceps tendinosis.  He plays a lot of basketball.  He was doing quite well with his knees.  He began having knee pain on the right at the insertion of the quadriceps a couple weeks ago though no specific injury.  Recently he played in a tournament and began having knee pain on the left behind the knee.  Again he denies any injuries he has been taking occasional ibuprofen  Assessment & Plan: Visit Diagnoses:  1. Acute pain of both knees   2. Bilateral chronic knee pain     Plan: Had a discussion with the patient I think this is just an exacerbation of a little bit of arthritis as well as his quadriceps tendinosis.  His quadriceps is functioning fine.  I cannot appreciate on the left any significant Baker's cyst or pain.  His knees are "cool.  I would like for him to not take ibuprofen and start for the next couple weeks on meloxicam.  Also can apply Voltaren to his anterior knees.  Follow-up with Dr. Roda Shutters  Follow-Up Instructions: No follow-ups on file.   Ortho Exam  Patient is alert, oriented, no adenopathy, well-dressed, normal affect, normal respiratory effort. Examination of his bilateral knees no effusions no redness no erythema.  No swelling.  On the right knee he does have some tenderness at the insertion of the quad tendon though he is able to sustain a straight leg raise without difficulty.  Compartments of the lower leg are soft and nontender no medial or  lateral joint line tenderness good varus valgus stability On the left knee he has no palpable Baker's cyst no medial lateral joint pain no medial lateral anterior instability.  Compartments are soft and compressible.  Imaging: XR KNEE 3 VIEW RIGHT  Result Date: 10/23/2021 X-rays of his right knee were taken in multiple projections.  Overall well-maintained alignment.  No acute fractures.  He does have significant patellofemoral arthritis with sclerotic changes and osteophyte formation especially at the superior pole with calcification within the patellar tendon he also does have some sclerotic changes and slight narrowing in the medial compartment  XR KNEE 3 VIEW LEFT  Result Date: 10/23/2021 Radiographs of his left knee were taken in multiple projections.  Overall well-maintained alignment he does have some sclerotic changes on the medial compartment with some slight joint space narrowing also quadriceps tendinosis with sclerotic changes and osteophyte formation no acute fractures  No images are attached to the encounter.  Labs: Lab Results  Component Value Date   REPTSTATUS 09/17/2016 FINAL 09/15/2016   GRAMSTAIN Rare 09/28/2012   GRAMSTAIN WBC present-predominately PMN 09/28/2012   GRAMSTAIN No Squamous Epithelial Cells Seen 09/28/2012   GRAMSTAIN Rare Gram Positive Cocci In Pairs 09/28/2012   CULT MODERATE GROUP A STREP (  S.PYOGENES) ISOLATED 09/15/2016   LABORGA METHICILLIN RESISTANT STAPHYLOCOCCUS AUREUS 09/28/2012     Lab Results  Component Value Date   ALBUMIN 4.2 03/03/2019   ALBUMIN 4.3 04/12/2017    No results found for: "MG" No results found for: "VD25OH"  No results found for: "PREALBUMIN"    Latest Ref Rng & Units 03/03/2019    2:36 AM 04/12/2017    5:28 PM 04/23/2012    1:21 PM  CBC EXTENDED  WBC 4.0 - 10.5 K/uL 13.7  6.4  7.6   RBC 4.22 - 5.81 MIL/uL 4.17  4.21  4.20   Hemoglobin 13.0 - 17.0 g/dL 40.0  86.7  61.9   HCT 39.0 - 52.0 % 40.8  40.3  41.6    Platelets 150 - 400 K/uL 289  295    NEUT# 1.7 - 7.7 K/uL  2.5    Lymph# 0.7 - 4.0 K/uL  3.3       There is no height or weight on file to calculate BMI.  Orders:  Orders Placed This Encounter  Procedures   XR KNEE 3 VIEW RIGHT   XR KNEE 3 VIEW LEFT   No orders of the defined types were placed in this encounter.    Procedures: No procedures performed  Clinical Data: No additional findings.  ROS:  All other systems negative, except as noted in the HPI. Review of Systems  Objective: Vital Signs: There were no vitals taken for this visit.  Specialty Comments:  No specialty comments available.  PMFS History: Patient Active Problem List   Diagnosis Date Noted   Bilateral chronic knee pain 10/23/2021   Left shoulder pain 03/14/2018   Allergy history unknown 07/20/2011   Past Medical History:  Diagnosis Date   Allergy     Family History  Problem Relation Age of Onset   COPD Mother     Past Surgical History:  Procedure Laterality Date   LAPAROSCOPIC APPENDECTOMY N/A 03/03/2019   Procedure: APPENDECTOMY LAPAROSCOPIC;  Surgeon: [name], MD;  Location: [facility];  Service: General;  Laterality: N/A;   Social History   Occupational History   Not on file  Tobacco Use   Smoking status: Never   Smokeless tobacco: Never  Substance and Sexual Activity   Alcohol use: Yes   Drug use: No   Sexual activity: Yes    Birth control/protection: None

## 2021-11-20 DIAGNOSIS — Z1211 Encounter for screening for malignant neoplasm of colon: Secondary | ICD-10-CM | POA: Diagnosis not present

## 2022-01-04 DIAGNOSIS — R718 Other abnormality of red blood cells: Secondary | ICD-10-CM | POA: Diagnosis not present

## 2022-01-04 DIAGNOSIS — E781 Pure hyperglyceridemia: Secondary | ICD-10-CM | POA: Diagnosis not present

## 2022-01-07 DIAGNOSIS — E781 Pure hyperglyceridemia: Secondary | ICD-10-CM | POA: Diagnosis not present

## 2022-01-07 DIAGNOSIS — I10 Essential (primary) hypertension: Secondary | ICD-10-CM | POA: Diagnosis not present

## 2022-01-07 DIAGNOSIS — Z23 Encounter for immunization: Secondary | ICD-10-CM | POA: Diagnosis not present

## 2022-01-07 DIAGNOSIS — E669 Obesity, unspecified: Secondary | ICD-10-CM | POA: Diagnosis not present

## 2022-01-07 DIAGNOSIS — G4733 Obstructive sleep apnea (adult) (pediatric): Secondary | ICD-10-CM | POA: Diagnosis not present

## 2022-02-06 DIAGNOSIS — R350 Frequency of micturition: Secondary | ICD-10-CM | POA: Diagnosis not present

## 2022-02-06 DIAGNOSIS — R399 Unspecified symptoms and signs involving the genitourinary system: Secondary | ICD-10-CM | POA: Diagnosis not present

## 2022-03-08 DIAGNOSIS — Z125 Encounter for screening for malignant neoplasm of prostate: Secondary | ICD-10-CM | POA: Diagnosis not present

## 2022-03-08 DIAGNOSIS — Z0000 Encounter for general adult medical examination without abnormal findings: Secondary | ICD-10-CM | POA: Diagnosis not present

## 2022-03-08 DIAGNOSIS — Z13220 Encounter for screening for lipoid disorders: Secondary | ICD-10-CM | POA: Diagnosis not present

## 2022-03-08 DIAGNOSIS — Z114 Encounter for screening for human immunodeficiency virus [HIV]: Secondary | ICD-10-CM | POA: Diagnosis not present

## 2022-03-08 DIAGNOSIS — E781 Pure hyperglyceridemia: Secondary | ICD-10-CM | POA: Diagnosis not present

## 2022-03-11 DIAGNOSIS — J069 Acute upper respiratory infection, unspecified: Secondary | ICD-10-CM | POA: Diagnosis not present

## 2022-03-15 DIAGNOSIS — E782 Mixed hyperlipidemia: Secondary | ICD-10-CM | POA: Diagnosis not present

## 2022-03-15 DIAGNOSIS — I10 Essential (primary) hypertension: Secondary | ICD-10-CM | POA: Diagnosis not present

## 2022-03-15 DIAGNOSIS — E669 Obesity, unspecified: Secondary | ICD-10-CM | POA: Diagnosis not present

## 2022-03-25 DIAGNOSIS — Z0000 Encounter for general adult medical examination without abnormal findings: Secondary | ICD-10-CM | POA: Diagnosis not present

## 2022-03-31 ENCOUNTER — Telehealth: Payer: Federal, State, Local not specified - PPO | Admitting: Nurse Practitioner

## 2022-03-31 DIAGNOSIS — J0110 Acute frontal sinusitis, unspecified: Secondary | ICD-10-CM

## 2022-03-31 DIAGNOSIS — J40 Bronchitis, not specified as acute or chronic: Secondary | ICD-10-CM | POA: Diagnosis not present

## 2022-03-31 MED ORDER — DOXYCYCLINE HYCLATE 100 MG PO TABS: 100.0000 mg | ORAL_TABLET | Freq: Two times a day (BID) | ORAL | 0 refills | Status: AC

## 2022-03-31 NOTE — Progress Notes (Signed)
Virtual Visit Consent   Zachary Harrington, you are scheduled for a virtual visit with a [facility] provider today. Just as with appointments in the office, your consent must be obtained to participate. Your consent will be active for this visit and any virtual visit you may have with one of our providers in the next 365 days. If you have a MyChart account, a copy of this consent can be sent to you electronically.  As this is a virtual visit, video technology does not allow for your provider to perform a traditional examination. This may limit your provider's ability to fully assess your condition. If your provider identifies any concerns that need to be evaluated in person or the need to arrange testing (such as labs, EKG, etc.), we will make arrangements to do so. Although advances in technology are sophisticated, we cannot ensure that it will always work on either your end or our end. If the connection with a video visit is poor, the visit may have to be switched to a telephone visit. With either a video or telephone visit, we are not always able to ensure that we have a secure connection.  By engaging in this virtual visit, you consent to the provision of healthcare and authorize for your insurance to be billed (if applicable) for the services provided during this visit. Depending on your insurance coverage, you may receive a charge related to this service.  I need to obtain your verbal consent now. Are you willing to proceed with your visit today? Zachary Harrington has provided verbal consent on 03/31/2022 for a virtual visit (video or telephone). Apolonio Schneiders, FNP  Date: 03/31/2022 11:13 AM  Virtual Visit via Video Note   I, Apolonio Schneiders, connected with  Zachary Harrington  ([id number], 11/03/1975) on 03/31/22 at 11:15 AM EST by a video-enabled telemedicine application and verified that I am speaking with the correct person using two identifiers.  Location: Patient: Virtual Visit Location  Patient: Home Provider: Virtual Visit Location Provider: Home Office   I discussed the limitations of evaluation and management by telemedicine and the availability of in person appointments. The patient expressed understanding and agreed to proceed.    History of Present Illness: Zachary Harrington is a 47 y.o. who identifies as a male who was assigned male at birth, and is being seen today for ongoing sinus symptoms.  He had a visit with urgent care on 03/11/22 and was treated for viral sinusitis. Given Flonase, Zyrtec and saline rinses.   He was initially feeling better and then over the past weekend he started to have worsening symptoms with a new onset cough.   He has noted that what he is coughing up is seaweed green   He denies a history of asthma or need for inhalers in the past   He has been using Mucinex as well   Problems:  Patient Active Problem List   Diagnosis Date Noted   Bilateral chronic knee pain 10/23/2021   Left shoulder pain 03/14/2018   Allergy history unknown 07/20/2011    Allergies: No Known Allergies  Medications:  Current Outpatient Medications:    cetirizine (ZYRTEC) 10 MG tablet, Take by mouth., Disp: , Rfl:    ibuprofen (ADVIL) 800 MG tablet, Take by mouth., Disp: , Rfl:    losartan (COZAAR) 50 MG tablet, Take 50 mg by mouth daily., Disp: , Rfl:    diclofenac (VOLTAREN) 75 MG EC tablet, Take 1 tablet (75 mg total) by mouth 2 (  two) times daily as needed., Disp: 60 tablet, Rfl: 0   fluticasone (FLONASE) 50 MCG/ACT nasal spray, Place into the nose., Disp: , Rfl:    meloxicam (MOBIC) 15 MG tablet, Take 1 tablet (15 mg total) by mouth daily., Disp: 30 tablet, Rfl: 0   Multiple Vitamins-Minerals (MEGA MULTI MEN PO), Take 1 tablet by mouth daily., Disp: , Rfl:    Observations/Objective: Patient is well-developed, well-nourished in no acute distress.  Resting comfortably  at home.  Head is normocephalic, atraumatic.  No labored breathing.  Speech is clear  and coherent with logical content.  Patient is alert and oriented at baseline.    Assessment and Plan: 1. Acute non-recurrent frontal sinusitis  - doxycycline (VIBRA-TABS) 100 MG tablet; Take 1 tablet (100 mg total) by mouth 2 (two) times daily for 10 days.  Dispense: 20 tablet; Refill: 0  2. Bronchitis Continue flonase and Mucinex   - doxycycline (VIBRA-TABS) 100 MG tablet; Take 1 tablet (100 mg total) by mouth 2 (two) times daily for 10 days.  Dispense: 20 tablet; Refill: 0     Follow Up Instructions: I discussed the assessment and treatment plan with the patient. The patient was provided an opportunity to ask questions and all were answered. The patient agreed with the plan and demonstrated an understanding of the instructions.  A copy of instructions were sent to the patient via MyChart unless otherwise noted below.    The patient was advised to call back or seek an in-person evaluation if the symptoms worsen or if the condition fails to improve as anticipated.  Time:  I spent 10 minutes with the patient via telehealth technology discussing the above problems/concerns.    Apolonio Schneiders, FNP

## 2022-05-06 DIAGNOSIS — D3131 Benign neoplasm of right choroid: Secondary | ICD-10-CM | POA: Diagnosis not present

## 2022-06-10 DIAGNOSIS — E782 Mixed hyperlipidemia: Secondary | ICD-10-CM | POA: Diagnosis not present

## 2022-06-16 DIAGNOSIS — E781 Pure hyperglyceridemia: Secondary | ICD-10-CM | POA: Diagnosis not present

## 2022-06-16 DIAGNOSIS — E669 Obesity, unspecified: Secondary | ICD-10-CM | POA: Diagnosis not present

## 2022-06-16 DIAGNOSIS — I10 Essential (primary) hypertension: Secondary | ICD-10-CM | POA: Diagnosis not present

## 2022-06-16 DIAGNOSIS — E782 Mixed hyperlipidemia: Secondary | ICD-10-CM | POA: Diagnosis not present

## 2022-06-17 ENCOUNTER — Ambulatory Visit: Payer: Federal, State, Local not specified - PPO | Admitting: Orthopaedic Surgery

## 2022-06-17 DIAGNOSIS — M1711 Unilateral primary osteoarthritis, right knee: Secondary | ICD-10-CM

## 2022-06-17 DIAGNOSIS — M1712 Unilateral primary osteoarthritis, left knee: Secondary | ICD-10-CM | POA: Diagnosis not present

## 2022-06-17 MED ORDER — METHYLPREDNISOLONE ACETATE 40 MG/ML IJ SUSP
40.0000 mg | INTRAMUSCULAR | Status: AC | PRN
Start: 2022-06-17 — End: 2022-06-17
  Administered 2022-06-17: 40 mg via INTRA_ARTICULAR

## 2022-06-17 MED ORDER — LIDOCAINE HCL 1 % IJ SOLN
2.0000 mL | INTRAMUSCULAR | Status: AC | PRN
Start: 2022-06-17 — End: 2022-06-17
  Administered 2022-06-17: 2 mL

## 2022-06-17 MED ORDER — BUPIVACAINE HCL 0.5 % IJ SOLN
2.0000 mL | INTRAMUSCULAR | Status: AC | PRN
Start: 2022-06-17 — End: 2022-06-17
  Administered 2022-06-17: 2 mL via INTRA_ARTICULAR

## 2022-06-17 NOTE — Progress Notes (Signed)
Office Visit Note   Patient: Zachary Harrington           Date of Birth: [birth year]           MRN: [id number] Visit Date: 06/17/2022              Requested by: Lewis Moccasin, MD [address] [place],  [place] [address] PCP: Lewis Moccasin, MD   Assessment & Plan: Visit Diagnoses:  1. Primary osteoarthritis of right knee   2. Primary osteoarthritis of left knee     Plan: Impression is 47 year old gentleman with early osteoarthritis that has become more symptomatic.  Treatment options were discussed and he would like to try steroid injections today.  He tolerated these well.  He will also focus on strengthening his quadriceps at the gym.  Follow-up as needed.  Follow-Up Instructions: No follow-ups on file.   Orders:  No orders of the defined types were placed in this encounter.  No orders of the defined types were placed in this encounter.     Procedures: Large Joint Inj: bilateral knee on 06/17/2022 9:35 AM Indications: pain Details: 22 G needle  Arthrogram: No  Medications (Right): 2 mL lidocaine 1 %; 2 mL bupivacaine 0.5 %; 40 mg methylPREDNISolone acetate 40 MG/ML Medications (Left): 2 mL lidocaine 1 %; 2 mL bupivacaine 0.5 %; 40 mg methylPREDNISolone acetate 40 MG/ML Outcome: tolerated well, no immediate complications Patient was prepped and draped in the usual sterile fashion.       Clinical Data: No additional findings.   Subjective: Chief Complaint  Patient presents with   Right Knee - Pain   Left Knee - Pain    HPI  Patient is a very pleasant 47 year old gentleman who I have seen in the past who comes in for bilateral knee pain.  Worse with playing basketball and increased activity.  Currently uses ibuprofen, meloxicam, Voltaren gel as needed.  Plays basketball regularly.  Reports achy pain in the knees.  Review of Systems  Constitutional: Negative.   HENT: Negative.    Eyes: Negative.   Respiratory: Negative.    Cardiovascular:  Negative.   Gastrointestinal: Negative.   Endocrine: Negative.   Genitourinary: Negative.   Skin: Negative.   Allergic/Immunologic: Negative.   Neurological: Negative.   Hematological: Negative.   Psychiatric/Behavioral: Negative.    All other systems reviewed and are negative.    Objective: Vital Signs: There were no vitals taken for this visit.  Physical Exam Vitals and nursing note reviewed.  Constitutional:      Appearance: He is well-developed.  Pulmonary:     Effort: Pulmonary effort is normal.  Abdominal:     Palpations: Abdomen is soft.  Skin:    General: Skin is warm.  Neurological:     Mental Status: He is alert and oriented to person, place, and time.  Psychiatric:        Behavior: Behavior normal.        Thought Content: Thought content normal.        Judgment: Judgment normal.     Ortho Exam  Examination of bilateral knees showed no joint effusion.  No joint line tenderness.  Because her cruciates are stable.  Full range of motion.  Specialty Comments:  No specialty comments available.  Imaging: No results found.   PMFS History: Patient Active Problem List   Diagnosis Date Noted   Bilateral chronic knee pain 10/23/2021   Left shoulder pain 03/14/2018   Allergy history unknown 07/20/2011  Past Medical History:  Diagnosis Date   Allergy     Family History  Problem Relation Age of Onset   COPD Mother     Past Surgical History:  Procedure Laterality Date   LAPAROSCOPIC APPENDECTOMY N/A 03/03/2019   Procedure: APPENDECTOMY LAPAROSCOPIC;  Surgeon: [name], MD;  Location: [facility];  Service: General;  Laterality: N/A;   Social History   Occupational History   Not on file  Tobacco Use   Smoking status: Never   Smokeless tobacco: Never  Substance and Sexual Activity   Alcohol use: Yes   Drug use: No   Sexual activity: Yes    Birth control/protection: None

## 2022-08-10 DIAGNOSIS — M5032 Other cervical disc degeneration, mid-cervical region, unspecified level: Secondary | ICD-10-CM | POA: Diagnosis not present

## 2022-08-10 DIAGNOSIS — M9902 Segmental and somatic dysfunction of thoracic region: Secondary | ICD-10-CM | POA: Diagnosis not present

## 2022-08-10 DIAGNOSIS — M62830 Muscle spasm of back: Secondary | ICD-10-CM | POA: Diagnosis not present

## 2022-08-10 DIAGNOSIS — M9901 Segmental and somatic dysfunction of cervical region: Secondary | ICD-10-CM | POA: Diagnosis not present

## 2022-08-13 DIAGNOSIS — M9902 Segmental and somatic dysfunction of thoracic region: Secondary | ICD-10-CM | POA: Diagnosis not present

## 2022-08-13 DIAGNOSIS — M9901 Segmental and somatic dysfunction of cervical region: Secondary | ICD-10-CM | POA: Diagnosis not present

## 2022-08-13 DIAGNOSIS — M5032 Other cervical disc degeneration, mid-cervical region, unspecified level: Secondary | ICD-10-CM | POA: Diagnosis not present

## 2022-08-13 DIAGNOSIS — M62830 Muscle spasm of back: Secondary | ICD-10-CM | POA: Diagnosis not present

## 2022-09-16 DIAGNOSIS — E781 Pure hyperglyceridemia: Secondary | ICD-10-CM | POA: Diagnosis not present

## 2022-09-16 DIAGNOSIS — E782 Mixed hyperlipidemia: Secondary | ICD-10-CM | POA: Diagnosis not present

## 2022-09-16 DIAGNOSIS — I10 Essential (primary) hypertension: Secondary | ICD-10-CM | POA: Diagnosis not present

## 2022-09-20 DIAGNOSIS — I10 Essential (primary) hypertension: Secondary | ICD-10-CM | POA: Diagnosis not present

## 2022-09-20 DIAGNOSIS — Z789 Other specified health status: Secondary | ICD-10-CM | POA: Diagnosis not present

## 2022-09-20 DIAGNOSIS — E782 Mixed hyperlipidemia: Secondary | ICD-10-CM | POA: Diagnosis not present

## 2022-09-20 DIAGNOSIS — E669 Obesity, unspecified: Secondary | ICD-10-CM | POA: Diagnosis not present

## 2022-10-29 DIAGNOSIS — Z713 Dietary counseling and surveillance: Secondary | ICD-10-CM | POA: Diagnosis not present

## 2022-11-25 DIAGNOSIS — Z713 Dietary counseling and surveillance: Secondary | ICD-10-CM | POA: Diagnosis not present

## 2022-12-23 DIAGNOSIS — Z713 Dietary counseling and surveillance: Secondary | ICD-10-CM | POA: Diagnosis not present

## 2023-01-20 DIAGNOSIS — Z713 Dietary counseling and surveillance: Secondary | ICD-10-CM | POA: Diagnosis not present

## 2023-03-03 DIAGNOSIS — Z713 Dietary counseling and surveillance: Secondary | ICD-10-CM | POA: Diagnosis not present

## 2023-03-24 DIAGNOSIS — Z13220 Encounter for screening for lipoid disorders: Secondary | ICD-10-CM | POA: Diagnosis not present

## 2023-03-24 DIAGNOSIS — Z125 Encounter for screening for malignant neoplasm of prostate: Secondary | ICD-10-CM | POA: Diagnosis not present

## 2023-03-24 DIAGNOSIS — Z0000 Encounter for general adult medical examination without abnormal findings: Secondary | ICD-10-CM | POA: Diagnosis not present

## 2023-03-29 DIAGNOSIS — H6123 Impacted cerumen, bilateral: Secondary | ICD-10-CM | POA: Diagnosis not present

## 2023-03-29 DIAGNOSIS — Z0000 Encounter for general adult medical examination without abnormal findings: Secondary | ICD-10-CM | POA: Diagnosis not present

## 2023-03-29 DIAGNOSIS — I10 Essential (primary) hypertension: Secondary | ICD-10-CM | POA: Diagnosis not present

## 2023-04-07 DIAGNOSIS — Z713 Dietary counseling and surveillance: Secondary | ICD-10-CM | POA: Diagnosis not present

## 2023-05-04 DIAGNOSIS — R059 Cough, unspecified: Secondary | ICD-10-CM | POA: Diagnosis not present

## 2023-05-04 DIAGNOSIS — U071 COVID-19: Secondary | ICD-10-CM | POA: Diagnosis not present

## 2023-05-04 DIAGNOSIS — R509 Fever, unspecified: Secondary | ICD-10-CM | POA: Diagnosis not present

## 2023-06-23 DIAGNOSIS — E559 Vitamin D deficiency, unspecified: Secondary | ICD-10-CM | POA: Diagnosis not present

## 2023-06-23 DIAGNOSIS — R5383 Other fatigue: Secondary | ICD-10-CM | POA: Diagnosis not present

## 2023-06-27 DIAGNOSIS — Z789 Other specified health status: Secondary | ICD-10-CM | POA: Diagnosis not present

## 2023-06-27 DIAGNOSIS — E559 Vitamin D deficiency, unspecified: Secondary | ICD-10-CM | POA: Diagnosis not present

## 2023-06-27 DIAGNOSIS — E782 Mixed hyperlipidemia: Secondary | ICD-10-CM | POA: Diagnosis not present

## 2023-06-27 DIAGNOSIS — I10 Essential (primary) hypertension: Secondary | ICD-10-CM | POA: Diagnosis not present

## 2023-07-04 DIAGNOSIS — Z713 Dietary counseling and surveillance: Secondary | ICD-10-CM | POA: Diagnosis not present

## 2023-08-11 DIAGNOSIS — Z713 Dietary counseling and surveillance: Secondary | ICD-10-CM | POA: Diagnosis not present

## 2023-11-17 ENCOUNTER — Ambulatory Visit: Admitting: Sports Medicine

## 2023-11-17 ENCOUNTER — Ambulatory Visit (INDEPENDENT_AMBULATORY_CARE_PROVIDER_SITE_OTHER): Payer: Self-pay

## 2023-11-17 ENCOUNTER — Encounter: Payer: Self-pay | Admitting: Orthopaedic Surgery

## 2023-11-17 ENCOUNTER — Ambulatory Visit: Admitting: Orthopaedic Surgery

## 2023-11-17 ENCOUNTER — Other Ambulatory Visit: Payer: Self-pay

## 2023-11-17 DIAGNOSIS — M1712 Unilateral primary osteoarthritis, left knee: Secondary | ICD-10-CM

## 2023-11-17 MED ORDER — LIDOCAINE HCL 1 % IJ SOLN
2.0000 mL | INTRAMUSCULAR | Status: AC | PRN
  Administered 2023-11-17: 2 mL

## 2023-11-17 MED ORDER — BUPIVACAINE HCL 0.5 % IJ SOLN
2.0000 mL | INTRAMUSCULAR | Status: AC | PRN
  Administered 2023-11-17: 2 mL via INTRA_ARTICULAR

## 2023-11-17 MED ORDER — METHYLPREDNISOLONE ACETATE 40 MG/ML IJ SUSP
40.0000 mg | INTRAMUSCULAR | Status: AC | PRN
  Administered 2023-11-17: 40 mg via INTRA_ARTICULAR

## 2023-11-17 NOTE — Progress Notes (Signed)
 Office Visit Note   Patient: Zachary Harrington           Date of Birth: [birth year]           MRN: [id number] Visit Date: 11/17/2023              Requested by: Waylan Almarie SAUNDERS, MD [address] [place],  [place] [address] PCP: Waylan Almarie SAUNDERS, MD   Assessment & Plan: Visit Diagnoses:  1. Primary osteoarthritis of left knee     Plan: History of Present Illness Zachary Harrington is a 48 year old male with arthritis who presents with knee pain after playing basketball.  He experienced knee pain after playing three full-court basketball games last week, with hobbling by the third game. The pain is an overall ache, and he needs to keep his leg straightened as bending it for more than five minutes becomes uncomfortable. He attributes the pain to playing on an older, concrete court on [address]. He has arthritis, which he believes was aggravated by the recent activity. He has not had significant problems recently and has been able to continue playing basketball until this incident.  Physical Exam MUSCULOSKELETAL: No effusion.  Good range of motion.  Collaterals and cruciates are stable.  No joint line tenderness.  Assessment and Plan Left knee primary osteoarthritis flare Flare due to recent increased activity. No acute abnormalities noted. - Administered cortisone injection to the left knee. - Advised refraining from basketball for two weeks.  Follow-Up Instructions: No follow-ups on file.   Orders:  Orders Placed This Encounter  Procedures   Large Joint Inj: L knee   XR KNEE 3 VIEW LEFT   No orders of the defined types were placed in this encounter.     Procedures: Large Joint Inj: L knee on 11/17/2023 8:41 AM Details: 22 G needle Medications: 2 mL bupivacaine  0.5 %; 2 mL lidocaine  1 %; 40 mg methylPREDNISolone  acetate 40 MG/ML Outcome: tolerated well, no immediate complications Patient was prepped and draped in the usual sterile fashion.       Clinical  Data: No additional findings.   Subjective: Chief Complaint  Patient presents with   Left Knee - Pain    HPI  Review of Systems  Constitutional: Negative.   HENT: Negative.    Eyes: Negative.   Respiratory: Negative.    Cardiovascular: Negative.   Gastrointestinal: Negative.   Endocrine: Negative.   Genitourinary: Negative.   Skin: Negative.   Allergic/Immunologic: Negative.   Neurological: Negative.   Hematological: Negative.   Psychiatric/Behavioral: Negative.    All other systems reviewed and are negative.    Objective: Vital Signs: There were no vitals taken for this visit.  Physical Exam Vitals and nursing note reviewed.  Constitutional:      Appearance: He is well-developed.  HENT:     Head: Normocephalic and atraumatic.  Eyes:     Pupils: Pupils are equal, round, and reactive to light.  Pulmonary:     Effort: Pulmonary effort is normal.  Abdominal:     Palpations: Abdomen is soft.  Musculoskeletal:        General: Normal range of motion.     Cervical back: Neck supple.  Skin:    General: Skin is warm.  Neurological:     Mental Status: He is alert and oriented to person, place, and time.  Psychiatric:        Behavior: Behavior normal.        Thought Content: Thought content  normal.        Judgment: Judgment normal.     Ortho Exam  Specialty Comments:  No specialty comments available.  Imaging: XR KNEE 3 VIEW LEFT Result Date: 11/17/2023 X-rays of the left knee show no acute abnormalities.  Degenerative spurring of the patella.    PMFS History: Patient Active Problem List   Diagnosis Date Noted   Bilateral chronic knee pain 10/23/2021   Left shoulder pain 03/14/2018   Allergy history unknown 07/20/2011   Past Medical History:  Diagnosis Date   Allergy     Family History  Problem Relation Age of Onset   COPD Mother     Past Surgical History:  Procedure Laterality Date   LAPAROSCOPIC APPENDECTOMY N/A 03/03/2019   Procedure:  APPENDECTOMY LAPAROSCOPIC;  Surgeon: Ebbie Cough, MD;  Location: [facility];  Service: General;  Laterality: N/A;   Social History   Occupational History   Not on file  Tobacco Use   Smoking status: Never   Smokeless tobacco: Never  Substance and Sexual Activity   Alcohol use: Yes   Drug use: No   Sexual activity: Yes    Birth control/protection: None

## 2023-12-13 DIAGNOSIS — E663 Overweight: Secondary | ICD-10-CM | POA: Diagnosis not present

## 2023-12-13 DIAGNOSIS — G4733 Obstructive sleep apnea (adult) (pediatric): Secondary | ICD-10-CM | POA: Diagnosis not present

## 2023-12-13 DIAGNOSIS — Z6831 Body mass index (BMI) 31.0-31.9, adult: Secondary | ICD-10-CM | POA: Diagnosis not present

## 2024-01-02 ENCOUNTER — Encounter: Payer: Self-pay | Admitting: Radiology
